# Patient Record
Sex: Male | Born: 1965
Health system: Southern US, Community
[De-identification: ages and names within clinical notes are randomized; demographics above are authoritative.]

## PROBLEM LIST (undated history)

## (undated) DIAGNOSIS — M109 Gout, unspecified: Secondary | ICD-10-CM

## (undated) DIAGNOSIS — M2550 Pain in unspecified joint: Secondary | ICD-10-CM

## (undated) DIAGNOSIS — E669 Obesity, unspecified: Secondary | ICD-10-CM

## (undated) DIAGNOSIS — I2510 Atherosclerotic heart disease of native coronary artery without angina pectoris: Secondary | ICD-10-CM

## (undated) DIAGNOSIS — I10 Essential (primary) hypertension: Secondary | ICD-10-CM

## (undated) DIAGNOSIS — I252 Old myocardial infarction: Secondary | ICD-10-CM

## (undated) DIAGNOSIS — Z8719 Personal history of other diseases of the digestive system: Secondary | ICD-10-CM

## (undated) DIAGNOSIS — E785 Hyperlipidemia, unspecified: Secondary | ICD-10-CM

## (undated) HISTORY — DX: Essential (primary) hypertension: I10

## (undated) HISTORY — DX: Personal history of other diseases of the digestive system: Z87.19

## (undated) HISTORY — DX: Gout, unspecified: M10.9

## (undated) HISTORY — PX: CARDIAC CATHETERIZATION: SHX172

## (undated) HISTORY — DX: Obesity, unspecified: E66.9

## (undated) HISTORY — DX: Atherosclerotic heart disease of native coronary artery without angina pectoris: I25.10

## (undated) HISTORY — DX: Old myocardial infarction: I25.2

## (undated) HISTORY — DX: Pain in unspecified joint: M25.50

---

## 2011-07-04 ENCOUNTER — Ambulatory Visit: Payer: Self-pay | Admitting: Internal Medicine

## 2011-07-04 VITALS — BP 170/118 | HR 84 | Temp 97.5°F | Resp 18 | Ht 69.0 in | Wt 306.0 lb

## 2011-07-04 DIAGNOSIS — E669 Obesity, unspecified: Secondary | ICD-10-CM

## 2011-07-04 DIAGNOSIS — I10 Essential (primary) hypertension: Secondary | ICD-10-CM

## 2011-07-04 LAB — COMPREHENSIVE METABOLIC PANEL
ALT: 32 U/L (ref 0–53)
AST: 23 U/L (ref 0–37)
Albumin: 4.6 g/dL (ref 3.5–5.2)
Alkaline Phosphatase: 57 U/L (ref 39–117)
BUN: 15 mg/dL (ref 6–23)
CO2: 28 mEq/L (ref 19–32)
Calcium: 9.7 mg/dL (ref 8.4–10.5)
Chloride: 104 mEq/L (ref 96–112)
Creat: 0.85 mg/dL (ref 0.50–1.35)
Glucose, Bld: 96 mg/dL (ref 70–99)
Potassium: 4.1 mEq/L (ref 3.5–5.3)
Sodium: 140 mEq/L (ref 135–145)
Total Bilirubin: 0.6 mg/dL (ref 0.3–1.2)
Total Protein: 7 g/dL (ref 6.0–8.3)

## 2011-07-04 MED ORDER — LISINOPRIL 20 MG PO TABS: 20.0000 mg | ORAL_TABLET | Freq: Every day | ORAL | Status: DC

## 2011-07-04 NOTE — Progress Notes (Signed)
  Subjective:    Patient ID: Fred Maxwell, male    DOB: August 09, 1965, 46 y.o.   MRN: [id number]  HPI 46 y/o htn male occ smoker and very obese presents because pre hire screening revealed bp 170/120 No sob, cp, ha, swelling. Also has no insurance   Review of Systems    obesity Objective:   Physical Exam  Constitutional: He is oriented to person, place, and time. He appears well-nourished.  Cardiovascular: Normal rate, regular rhythm and normal heart sounds.   Pulmonary/Chest: Effort normal and breath sounds normal.  Neurological: He is alert and oriented to person, place, and time. Coordination normal.  Skin: Skin is warm and dry.  Psychiatric: He has a normal mood and affect.   BP with thigh cuff 170/118   cmet    Assessment & Plan:  HTN and obesity Continue amlodipine 10mg  qhs Add lisinopril 20mg  qam F/up 2weeks

## 2011-07-04 NOTE — Patient Instructions (Signed)
Hypertension As your heart beats, it forces blood through your arteries. This force is your blood pressure. If the pressure is too high, it is called hypertension (HTN) or high blood pressure. HTN is dangerous because you may have it and not know it. High blood pressure may mean that your heart has to work harder to pump blood. Your arteries may be narrow or stiff. The extra work puts you at risk for heart disease, stroke, and other problems.  Blood pressure consists of two numbers, a higher number over a lower, 110/72, for example. It is stated as "110 over 72." The ideal is below 120 for the top number (systolic) and under 80 for the bottom (diastolic). Write down your blood pressure today. You should pay close attention to your blood pressure if you have certain conditions such as:  Heart failure.   Prior heart attack.   Diabetes   Chronic kidney disease.   Prior stroke.   Multiple risk factors for heart disease.  To see if you have HTN, your blood pressure should be measured while you are seated with your arm held at the level of the heart. It should be measured at least twice. A one-time elevated blood pressure reading (especially in the Emergency Department) does not mean that you need treatment. There may be conditions in which the blood pressure is different between your right and left arms. It is important to see your caregiver soon for a recheck. Most people have essential hypertension which means that there is not a specific cause. This type of high blood pressure may be lowered by changing lifestyle factors such as:  Stress.   Smoking.   Lack of exercise.   Excessive weight.   Drug/tobacco/alcohol use.   Eating less salt.  Most people do not have symptoms from high blood pressure until it has caused damage to the body. Effective treatment can often prevent, delay or reduce that damage. TREATMENT  When a cause has been identified, treatment for high blood pressure is  directed at the cause. There are a large number of medications to treat HTN. These fall into several categories, and your caregiver will help you select the medicines that are best for you. Medications may have side effects. You should review side effects with your caregiver. If your blood pressure stays high after you have made lifestyle changes or started on medicines,   Your medication(s) may need to be changed.   Other problems may need to be addressed.   Be certain you understand your prescriptions, and know how and when to take your medicine.   Be sure to follow up with your caregiver within the time frame advised (usually within two weeks) to have your blood pressure rechecked and to review your medications.   If you are taking more than one medicine to lower your blood pressure, make sure you know how and at what times they should be taken. Taking two medicines at the same time can result in blood pressure that is too low.  SEEK IMMEDIATE MEDICAL CARE IF:  You develop a severe headache, blurred or changing vision, or confusion.   You have unusual weakness or numbness, or a faint feeling.   You have severe chest or abdominal pain, vomiting, or breathing problems.  MAKE SURE YOU:   Understand these instructions.   Will watch your condition.   Will get help right away if you are not doing well or get worse.  Document Released: 01/21/2005 Document Revised: 01/10/2011 Document Reviewed:   09/11/2007 [employer] Patient Information 2012 [place], [place].DASH Diet The DASH diet stands for "Dietary Approaches to Stop Hypertension." It is a healthy eating plan that has been shown to reduce high blood pressure (hypertension) in as little as 14 days, while also possibly providing other significant health benefits. These other health benefits include reducing the risk of breast cancer after menopause and reducing the risk of type 2 diabetes, heart disease, colon cancer, and stroke. Health benefits  also include weight loss and slowing kidney failure in patients with chronic kidney disease.  DIET GUIDELINES  Limit salt (sodium). Your diet should contain less than 1500 mg of sodium daily.   Limit refined or processed carbohydrates. Your diet should include mostly whole grains. Desserts and added sugars should be used sparingly.   Include small amounts of heart-healthy fats. These types of fats include nuts, oils, and tub margarine. Limit saturated and trans fats. These fats have been shown to be harmful in the body.  CHOOSING FOODS  The following food groups are based on a 2000 calorie diet. See your Registered Dietitian for individual calorie needs. Grains and Grain Products (6 to 8 servings daily)  Eat More Often: Whole-wheat bread, brown rice, whole-grain or wheat pasta, quinoa, popcorn without added fat or salt (air popped).   Eat Less Often: White bread, white pasta, white rice, cornbread.  Vegetables (4 to 5 servings daily)  Eat More Often: Fresh, frozen, and canned vegetables. Vegetables may be raw, steamed, roasted, or grilled with a minimal amount of fat.   Eat Less Often/Avoid: Creamed or fried vegetables. Vegetables in a cheese sauce.  Fruit (4 to 5 servings daily)  Eat More Often: All fresh, canned (in natural juice), or frozen fruits. Dried fruits without added sugar. One hundred percent fruit juice ( cup [237 mL] daily).   Eat Less Often: Dried fruits with added sugar. Canned fruit in light or heavy syrup.  [employer], Fish, and Poultry (2 servings or less daily. One serving is 3 to 4 oz [85-114 g]).  Eat More Often: Ninety percent or leaner ground beef, tenderloin, sirloin. Round cuts of beef, chicken breast, [place] breast. All fish. Grill, bake, or broil your meat. Nothing should be fried.   Eat Less Often/Avoid: Fatty cuts of meat, [place], or chicken leg, thigh, or wing. Fried cuts of meat or fish.  Dairy (2 to 3 servings)  Eat More Often: Low-fat or fat-free  milk, low-fat plain or light yogurt, reduced-fat or part-skim cheese.   Eat Less Often/Avoid: Milk (whole, 2%, skim, or chocolate).Whole milk yogurt. Full-fat cheeses.  Nuts, Seeds, and Legumes (4 to 5 servings per week)  Eat More Often: All without added salt.   Eat Less Often/Avoid: Salted nuts and seeds, canned beans with added salt.  Fats and Sweets (limited)  Eat More Often: Vegetable oils, tub margarines without trans fats, sugar-free gelatin. Mayonnaise and salad dressings.   Eat Less Often/Avoid: Coconut oils, palm oils, butter, stick margarine, cream, half and half, cookies, candy, pie.  FOR MORE INFORMATION The Dash Diet Eating Plan: www.dashdiet.org Document Released: 01/10/2011 Document Reviewed: 12/31/2010 [facility] Patient Information 2012 [place], [place].Sleep Apnea Sleep apnea is a common disorder. The main problem of this disorder is excessive daytime sleepiness and compromised quality of life. This may include social and emotional problems. There are two types of sleep apnea.  Obstructive sleep apnea is when breathing stops due to a blocked airway.   Central sleep apnea is a malfunction of the brain's normal signal to breathe.  SYMPTOMS  Restless sleep.   Falling asleep while driving and/or during the day.   Loss of energy.   Irritability.   Mood or behavior changes.   Loud, heavy snoring.   Morning headaches.   Trouble concentrating.   Forgetfulness.   Anxiety or depression.   Decreased interest in sex.  Not all people with sleep apnea have all of these symptoms. However, people who have a few of these symptoms should visit their caregiver for an evaluation. Problems related to untreated sleep apnea include:  High blood pressure (hypertension).   Coronary artery disease.   Impotence.   Cognitive dysfunction.   Memory loss.  TREATMENT  For mild cases, treatment may include avoiding sleeping on one's back.   For people with nasal  congestion, a decongestant may be prescribed.   Patients with obstructive and central apnea should avoid depressants. This includes alcohol, sedatives and narcotics. Weight loss and diet control are encouraged for overweight patients.   Many serious cases of obstructive sleep apnea can be relieved by a treatment called nasal continuous positive airway pressure (nasal CPAP). Nasal CPAP uses a mask-like device and pump that work together to keep the airway open. The pump delivers air pressure during each breath.   Surgery may help some patients by stopping or reducing the narrowing of the airway due to anatomical defects.  PROGNOSIS  Removing the obstruction usually reverses hypertension and cardiac problems. Untreated, sleep apnea sufferers have a tendency to fall asleep during the day. This is can result in serious accident or loss of ones job. RESEARCH Sleep apnea is currently one of the most active areas of sleep research.  Document Released: 01/11/2002 Document Revised: 01/10/2011 Document Reviewed: 05/09/2005 [facility] Patient Information 2012 [place], [place].

## 2011-07-09 ENCOUNTER — Encounter: Payer: Self-pay | Admitting: *Deleted

## 2015-03-24 ENCOUNTER — Encounter (HOSPITAL_COMMUNITY): Payer: Self-pay | Admitting: Emergency Medicine

## 2015-03-24 ENCOUNTER — Emergency Department (HOSPITAL_COMMUNITY): Payer: PRIVATE HEALTH INSURANCE

## 2015-03-24 ENCOUNTER — Emergency Department (HOSPITAL_COMMUNITY)
Admission: EM | Admit: 2015-03-24 | Discharge: 2015-03-24 | Disposition: A | Discharge status: Home / Self Care | Payer: PRIVATE HEALTH INSURANCE | Attending: Physician Assistant | Admitting: Physician Assistant

## 2015-03-24 DIAGNOSIS — N50811 Right testicular pain: Secondary | ICD-10-CM | POA: Diagnosis present

## 2015-03-24 DIAGNOSIS — F17200 Nicotine dependence, unspecified, uncomplicated: Secondary | ICD-10-CM | POA: Diagnosis not present

## 2015-03-24 DIAGNOSIS — Z79899 Other long term (current) drug therapy: Secondary | ICD-10-CM | POA: Insufficient documentation

## 2015-03-24 DIAGNOSIS — N50819 Testicular pain, unspecified: Secondary | ICD-10-CM

## 2015-03-24 DIAGNOSIS — I10 Essential (primary) hypertension: Secondary | ICD-10-CM | POA: Diagnosis not present

## 2015-03-24 NOTE — ED Notes (Signed)
Ultrasound in process at bedside at this time. 

## 2015-03-24 NOTE — ED Notes (Signed)
Bed: JJ88 Expected date:  Expected time:  Means of arrival:  Comments: Hold for [facility]

## 2015-03-24 NOTE — ED Notes (Signed)
Pt states that he has had R sided testicular pain x 6 days. Came in by PCP to r/o torsion. Alert and oriented. Denies dysuria.

## 2015-03-24 NOTE — ED Provider Notes (Signed)
CSN: [id number]     Arrival date & time 03/24/15  1952 History   First MD Initiated Contact with Patient 03/24/15 1958     Chief Complaint  Patient presents with  . Testicle Pain     (Consider location/radiation/quality/duration/timing/severity/associated sxs/prior Treatment) HPI Patient presents to the emergency department with right testicle pain that started 6 days ago.  The patient states that he was seen by a walk-in clinic and they felt that his right testicle is high riding and they were concerned about it.  Patient states that it actually has not been hurting as much over the last 12 hours.  Patient states that nothing seems to make the condition better or worse.  Patient denies abdominal pain, nausea, vomiting, weakness, dizziness, headache, blurred vision, diarrhea, fever, incontinence, chest pain, shortness of breath, near syncope or syncope.  The patient states that he has had no problems urinating and no pain with urination Past Medical History  Diagnosis Date  . Hypertension    History reviewed. No pertinent past surgical history. History reviewed. No pertinent family history. Social History  Substance Use Topics  . Smoking status: Current Some Day Smoker  . Smokeless tobacco: None  . Alcohol Use: None    Review of Systems All other systems negative except as documented in the HPI. All pertinent positives and negatives as reviewed in the HPI.   Allergies  Morphine and related  Home Medications   Prior to Admission medications   Medication Sig Start Date End Date Taking? Authorizing Provider  ibuprofen (ADVIL,MOTRIN) 200 MG tablet Take 800 mg by mouth every 6 (six) hours as needed for moderate pain.   Yes Historical Provider, MD  lisinopril (PRINIVIL,ZESTRIL) 20 MG tablet Take 20 mg by mouth daily.   Yes Historical Provider, MD   BP 152/101 mmHg  Pulse 78  Temp(Src) 97.6 F (36.4 C) (Oral)  Resp 18  SpO2 100% Physical Exam  Constitutional: He is oriented to  person, place, and time. He appears well-developed and well-nourished. No distress.  HENT:  Head: Normocephalic and atraumatic.  Mouth/Throat: Oropharynx is clear and moist.  Eyes: Pupils are equal, round, and reactive to light.  Neck: Normal range of motion. Neck supple.  Cardiovascular: Normal rate, regular rhythm and normal heart sounds.  Exam reveals no gallop and no friction rub.   No murmur heard. Pulmonary/Chest: Effort normal and breath sounds normal. No respiratory distress. He has no wheezes.  Abdominal: Soft. Bowel sounds are normal. He exhibits no distension. There is no tenderness.  Genitourinary: Penis normal. Right testis shows no mass, no swelling and no tenderness. Right testis is descended. Left testis shows no mass, no swelling and no tenderness. Left testis is descended.  The patient testicle and did not appear to be into the inguinal canal.  I did attempt to relocate the testicle back into the scrotum and was successful.  The patient did feel some relief  Neurological: He is alert and oriented to person, place, and time. He exhibits normal muscle tone. Coordination normal.  Skin: Skin is warm and dry. No rash noted. No erythema.  Psychiatric: He has a normal mood and affect. His behavior is normal.  Nursing note and vitals reviewed.   ED Course  Procedures (including critical care time) Labs Review Labs Reviewed - No data to display  Imaging Review US Scrotum  03/24/2015  CLINICAL DATA:  Right-sided scrotal pain 6 days. Evaluate for testicular torsion. EXAM: SCROTAL ULTRASOUND DOPPLER ULTRASOUND OF THE TESTICLES TECHNIQUE: Complete ultrasound  examination of the testicles, epididymis, and other scrotal structures was performed. Color and spectral Doppler ultrasound were also utilized to evaluate blood flow to the testicles. COMPARISON:  None. FINDINGS: Right testicle Measurements: 1.8 x 2.6 x 3.3 cm. No mass or microlithiasis visualized. Focal calcification inferior right  scrotum without clinical significance. Left testicle Measurements: 1.2 x 2.6 x 3.8 cm. No mass or microlithiasis visualized. Right epididymis:  Normal in size and appearance. Left epididymis:  Normal in size and appearance. Hydrocele:  Small bilateral hydroceles. Varicocele:  None visualized. Pulsed Doppler interrogation of both testes demonstrates normal low resistance arterial and venous waveforms bilaterally. IMPRESSION: Normal testicles without evidence of torsion. Small bilateral hydroceles. Electronically Signed   By: Elberta Fortis M.D.   On: 03/24/2015 21:28   [place] Art/ven Flow Abd Pelv Doppler  03/24/2015  CLINICAL DATA:  Right-sided scrotal pain 6 days. Evaluate for testicular torsion. EXAM: SCROTAL ULTRASOUND DOPPLER ULTRASOUND OF THE TESTICLES TECHNIQUE: Complete ultrasound examination of the testicles, epididymis, and other scrotal structures was performed. Color and spectral Doppler ultrasound were also utilized to evaluate blood flow to the testicles. COMPARISON:  None. FINDINGS: Right testicle Measurements: 1.8 x 2.6 x 3.3 cm. No mass or microlithiasis visualized. Focal calcification inferior right scrotum without clinical significance. Left testicle Measurements: 1.2 x 2.6 x 3.8 cm. No mass or microlithiasis visualized. Right epididymis:  Normal in size and appearance. Left epididymis:  Normal in size and appearance. Hydrocele:  Small bilateral hydroceles. Varicocele:  None visualized. Pulsed Doppler interrogation of both testes demonstrates normal low resistance arterial and venous waveforms bilaterally. IMPRESSION: Normal testicles without evidence of torsion. Small bilateral hydroceles. Electronically Signed   By: Elberta Fortis M.D.   On: 03/24/2015 21:28   I have personally reviewed and evaluated these images and lab results as part of my medical decision-making.    I will refer the patient to urology.  Told to return here as needed.  The patient's ultrasound did not show any abnormality  other than small hydroceles bilaterally but I do not feel this is causing his pain.  I feel this is more related to the fact the testicles was not completely descended in proper position in the scrotum  Charlestine Night, PA-C 03/24/15 2206  Courteney Lyn Mackuen, MD 03/24/15 2247

## 2015-03-24 NOTE — Discharge Instructions (Signed)
Tylenol and motrin for pain.

## 2016-12-25 ENCOUNTER — Emergency Department (HOSPITAL_COMMUNITY): Payer: 59

## 2016-12-25 ENCOUNTER — Encounter (HOSPITAL_COMMUNITY)
Admission: EM | Disposition: A | Discharge status: Home / Self Care | Payer: Self-pay | Source: Home / Self Care | Attending: Internal Medicine

## 2016-12-25 ENCOUNTER — Encounter (HOSPITAL_COMMUNITY): Payer: Self-pay | Admitting: *Deleted

## 2016-12-25 ENCOUNTER — Inpatient Hospital Stay (HOSPITAL_COMMUNITY)
Admission: EM | Admit: 2016-12-25 | Discharge: 2016-12-27 | DRG: 247 | Disposition: A | Discharge status: Home / Self Care | Payer: 59 | Attending: Internal Medicine | Admitting: Internal Medicine

## 2016-12-25 DIAGNOSIS — Z8249 Family history of ischemic heart disease and other diseases of the circulatory system: Secondary | ICD-10-CM

## 2016-12-25 DIAGNOSIS — Z23 Encounter for immunization: Secondary | ICD-10-CM

## 2016-12-25 DIAGNOSIS — Z7951 Long term (current) use of inhaled steroids: Secondary | ICD-10-CM

## 2016-12-25 DIAGNOSIS — I252 Old myocardial infarction: Secondary | ICD-10-CM

## 2016-12-25 DIAGNOSIS — M109 Gout, unspecified: Secondary | ICD-10-CM | POA: Diagnosis present

## 2016-12-25 DIAGNOSIS — R079 Chest pain, unspecified: Secondary | ICD-10-CM | POA: Diagnosis present

## 2016-12-25 DIAGNOSIS — I2510 Atherosclerotic heart disease of native coronary artery without angina pectoris: Secondary | ICD-10-CM | POA: Diagnosis present

## 2016-12-25 DIAGNOSIS — Z955 Presence of coronary angioplasty implant and graft: Secondary | ICD-10-CM

## 2016-12-25 DIAGNOSIS — I10 Essential (primary) hypertension: Secondary | ICD-10-CM | POA: Diagnosis present

## 2016-12-25 DIAGNOSIS — I214 Non-ST elevation (NSTEMI) myocardial infarction: Principal | ICD-10-CM

## 2016-12-25 DIAGNOSIS — E785 Hyperlipidemia, unspecified: Secondary | ICD-10-CM | POA: Diagnosis present

## 2016-12-25 DIAGNOSIS — I160 Hypertensive urgency: Secondary | ICD-10-CM | POA: Diagnosis present

## 2016-12-25 DIAGNOSIS — G4730 Sleep apnea, unspecified: Secondary | ICD-10-CM | POA: Diagnosis present

## 2016-12-25 DIAGNOSIS — K219 Gastro-esophageal reflux disease without esophagitis: Secondary | ICD-10-CM | POA: Diagnosis present

## 2016-12-25 DIAGNOSIS — Z885 Allergy status to narcotic agent status: Secondary | ICD-10-CM | POA: Diagnosis not present

## 2016-12-25 DIAGNOSIS — Z6841 Body Mass Index (BMI) 40.0 and over, adult: Secondary | ICD-10-CM | POA: Diagnosis not present

## 2016-12-25 HISTORY — DX: Old myocardial infarction: I25.2

## 2016-12-25 HISTORY — PX: LEFT HEART CATH AND CORONARY ANGIOGRAPHY: CATH118249

## 2016-12-25 HISTORY — PX: CORONARY STENT INTERVENTION: CATH118234

## 2016-12-25 LAB — PROTIME-INR
INR: 0.97
Prothrombin Time: 12.8 seconds (ref 11.4–15.2)

## 2016-12-25 LAB — CBC
HCT: 44 % (ref 39.0–52.0)
Hemoglobin: 15.9 g/dL (ref 13.0–17.0)
MCH: 33.3 pg (ref 26.0–34.0)
MCHC: 36.1 g/dL — ABNORMAL HIGH (ref 30.0–36.0)
MCV: 92.1 fL (ref 78.0–100.0)
Platelets: 156 10*3/uL (ref 150–400)
RBC: 4.78 MIL/uL (ref 4.22–5.81)
RDW: 12.6 % (ref 11.5–15.5)
WBC: 8.3 10*3/uL (ref 4.0–10.5)

## 2016-12-25 LAB — TROPONIN I
Troponin I: 0.8 ng/mL (ref <0.03)
Troponin I: 4.28 ng/mL (ref <0.03)
Troponin I: 34.41 ng/mL (ref <0.03)

## 2016-12-25 LAB — BASIC METABOLIC PANEL
Anion gap: 8 (ref 5–15)
BUN: 10 mg/dL (ref 6–20)
CO2: 26 mmol/L (ref 22–32)
Calcium: 10.9 mg/dL — ABNORMAL HIGH (ref 8.9–10.3)
Chloride: 104 mmol/L (ref 101–111)
Creatinine, Ser: 0.89 mg/dL (ref 0.61–1.24)
GFR calc Af Amer: >60 mL/min (ref >60)
GFR calc non Af Amer: >60 mL/min (ref >60)
Glucose, Bld: 132 mg/dL — ABNORMAL HIGH (ref 65–99)
Potassium: 4 mmol/L (ref 3.5–5.1)
Sodium: 138 mmol/L (ref 135–145)

## 2016-12-25 LAB — LIPID PANEL
Cholesterol: 177 mg/dL (ref 0–200)
HDL: 39 mg/dL — ABNORMAL LOW (ref >40)
LDL Cholesterol: 118 mg/dL — ABNORMAL HIGH (ref 0–99)
Total CHOL/HDL Ratio: 4.5 RATIO
Triglycerides: 102 mg/dL (ref <150)
VLDL: 20 mg/dL (ref 0–40)

## 2016-12-25 LAB — HEMOGLOBIN A1C
Hgb A1c MFr Bld: 5.6 % (ref 4.8–5.6)
Mean Plasma Glucose: 114.02 mg/dL

## 2016-12-25 LAB — RAPID URINE DRUG SCREEN, HOSP PERFORMED
Amphetamines: NOT DETECTED
Barbiturates: NOT DETECTED
Benzodiazepines: NOT DETECTED
Cocaine: NOT DETECTED
Opiates: NOT DETECTED
Tetrahydrocannabinol: NOT DETECTED

## 2016-12-25 LAB — I-STAT TROPONIN, ED: Troponin i, poc: 0.26 ng/mL (ref 0.00–0.08)

## 2016-12-25 LAB — POCT ACTIVATED CLOTTING TIME
Activated Clotting Time: 241 seconds
Activated Clotting Time: 422 seconds

## 2016-12-25 SURGERY — LEFT HEART CATH AND CORONARY ANGIOGRAPHY
Anesthesia: LOCAL

## 2016-12-25 MED ORDER — FENTANYL CITRATE (PF) 100 MCG/2ML IJ SOLN
INTRAMUSCULAR | Status: AC
  Filled 2016-12-25: qty 2

## 2016-12-25 MED ORDER — ONDANSETRON HCL 4 MG/2ML IJ SOLN
4.0000 mg | Freq: Four times a day (QID) | INTRAMUSCULAR | Status: DC | PRN
Start: 2016-12-25 — End: 2016-12-27
  Administered 2016-12-25: 4 mg via INTRAVENOUS
  Filled 2016-12-25: qty 2

## 2016-12-25 MED ORDER — ACETAMINOPHEN 325 MG PO TABS
650.0000 mg | ORAL_TABLET | ORAL | Status: DC | PRN
  Administered 2016-12-25 – 2016-12-27 (×7): 650 mg via ORAL
  Filled 2016-12-25 (×8): qty 2

## 2016-12-25 MED ORDER — TICAGRELOR 90 MG PO TABS
ORAL_TABLET | ORAL | Status: AC
  Filled 2016-12-25: qty 2

## 2016-12-25 MED ORDER — FENTANYL CITRATE (PF) 100 MCG/2ML IJ SOLN
50.0000 ug | Freq: Once | INTRAMUSCULAR | Status: AC
  Administered 2016-12-25: 50 ug via INTRAVENOUS
  Filled 2016-12-25: qty 2

## 2016-12-25 MED ORDER — IOPAMIDOL (ISOVUE-370) INJECTION 76%
INTRAVENOUS | Status: AC
  Filled 2016-12-25: qty 100

## 2016-12-25 MED ORDER — HEPARIN (PORCINE) IN NACL 2-0.9 UNIT/ML-% IJ SOLN
INTRAMUSCULAR | Status: AC
  Filled 2016-12-25: qty 1000

## 2016-12-25 MED ORDER — PNEUMOCOCCAL VAC POLYVALENT 25 MCG/0.5ML IJ INJ
0.5000 mL | INJECTION | INTRAMUSCULAR | Status: AC
  Administered 2016-12-26: 0.5 mL via INTRAMUSCULAR
  Filled 2016-12-25: qty 0.5

## 2016-12-25 MED ORDER — SODIUM CHLORIDE 0.9 % IV SOLN: 250.0000 mL | INTRAVENOUS | Status: DC | PRN

## 2016-12-25 MED ORDER — FLUTICASONE PROPIONATE 50 MCG/ACT NA SUSP: 2.0000 | Freq: Every day | NASAL | Status: DC | PRN

## 2016-12-25 MED ORDER — LABETALOL HCL 5 MG/ML IV SOLN
10.0000 mg | INTRAVENOUS | Status: AC | PRN
  Administered 2016-12-25: 10 mg via INTRAVENOUS
  Filled 2016-12-25 (×3): qty 4

## 2016-12-25 MED ORDER — PANTOPRAZOLE SODIUM 40 MG IV SOLR
40.0000 mg | Freq: Every day | INTRAVENOUS | Status: DC
  Administered 2016-12-25 – 2016-12-26 (×2): 40 mg via INTRAVENOUS
  Filled 2016-12-25 (×2): qty 40

## 2016-12-25 MED ORDER — SODIUM CHLORIDE 0.9% FLUSH
3.0000 mL | Freq: Two times a day (BID) | INTRAVENOUS | Status: DC
  Administered 2016-12-25 – 2016-12-27 (×4): 3 mL via INTRAVENOUS

## 2016-12-25 MED ORDER — INFLUENZA VAC SPLIT QUAD 0.5 ML IM SUSY
0.5000 mL | PREFILLED_SYRINGE | INTRAMUSCULAR | Status: AC
  Administered 2016-12-26: 0.5 mL via INTRAMUSCULAR
  Filled 2016-12-25: qty 0.5

## 2016-12-25 MED ORDER — HEPARIN (PORCINE) IN NACL 100-0.45 UNIT/ML-% IJ SOLN
1350.0000 [IU]/h | INTRAMUSCULAR | Status: DC
  Administered 2016-12-25: 1350 [IU]/h via INTRAVENOUS
  Filled 2016-12-25: qty 250

## 2016-12-25 MED ORDER — ALPRAZOLAM 0.25 MG PO TABS
0.2500 mg | ORAL_TABLET | Freq: Two times a day (BID) | ORAL | Status: DC | PRN
  Administered 2016-12-25: 0.25 mg via ORAL
  Filled 2016-12-25: qty 1

## 2016-12-25 MED ORDER — MIDAZOLAM HCL 2 MG/2ML IJ SOLN
INTRAMUSCULAR | Status: AC
  Filled 2016-12-25: qty 2

## 2016-12-25 MED ORDER — ZOLPIDEM TARTRATE 5 MG PO TABS: 5.0000 mg | ORAL_TABLET | Freq: Every evening | ORAL | Status: DC | PRN

## 2016-12-25 MED ORDER — NITROGLYCERIN 1 MG/10 ML FOR IR/CATH LAB
INTRA_ARTERIAL | Status: DC | PRN
  Administered 2016-12-25: 200 ug via INTRACORONARY
  Administered 2016-12-25: 100 ug via INTRACORONARY

## 2016-12-25 MED ORDER — GI COCKTAIL ~~LOC~~: 30.0000 mL | Freq: Once | ORAL | Status: DC

## 2016-12-25 MED ORDER — IRBESARTAN 150 MG PO TABS
150.0000 mg | ORAL_TABLET | Freq: Every day | ORAL | Status: DC
  Filled 2016-12-25: qty 1

## 2016-12-25 MED ORDER — IOPAMIDOL (ISOVUE-370) INJECTION 76%
INTRAVENOUS | Status: DC | PRN
  Administered 2016-12-25: 180 mL via INTRA_ARTERIAL

## 2016-12-25 MED ORDER — METOPROLOL TARTRATE 25 MG PO TABS
25.0000 mg | ORAL_TABLET | Freq: Two times a day (BID) | ORAL | Status: DC
  Administered 2016-12-25: 25 mg via ORAL
  Filled 2016-12-25: qty 1

## 2016-12-25 MED ORDER — HEPARIN SODIUM (PORCINE) 1000 UNIT/ML IJ SOLN
INTRAMUSCULAR | Status: AC
  Filled 2016-12-25: qty 1

## 2016-12-25 MED ORDER — HEPARIN (PORCINE) IN NACL 100-0.45 UNIT/ML-% IJ SOLN: 12.0000 [IU]/kg/h | INTRAMUSCULAR | Status: DC

## 2016-12-25 MED ORDER — HEPARIN BOLUS VIA INFUSION
4000.0000 [IU] | Freq: Once | INTRAVENOUS | Status: AC
  Administered 2016-12-25: 4000 [IU] via INTRAVENOUS
  Filled 2016-12-25: qty 4000

## 2016-12-25 MED ORDER — VERAPAMIL HCL 2.5 MG/ML IV SOLN
INTRAVENOUS | Status: DC | PRN
  Administered 2016-12-25: 16:00:00 via INTRA_ARTERIAL

## 2016-12-25 MED ORDER — ASPIRIN EC 81 MG PO TBEC
81.0000 mg | DELAYED_RELEASE_TABLET | Freq: Every day | ORAL | Status: DC
  Administered 2016-12-26 – 2016-12-27 (×2): 81 mg via ORAL
  Filled 2016-12-25 (×2): qty 1

## 2016-12-25 MED ORDER — LIDOCAINE HCL (PF) 1 % IJ SOLN
INTRAMUSCULAR | Status: AC
  Filled 2016-12-25: qty 30

## 2016-12-25 MED ORDER — SODIUM CHLORIDE 0.9 % IV SOLN
INTRAVENOUS | Status: AC
  Administered 2016-12-25 – 2016-12-26 (×2): via INTRAVENOUS

## 2016-12-25 MED ORDER — ENOXAPARIN SODIUM 40 MG/0.4ML ~~LOC~~ SOLN
40.0000 mg | SUBCUTANEOUS | Status: DC
  Administered 2016-12-26 – 2016-12-27 (×2): 40 mg via SUBCUTANEOUS
  Filled 2016-12-25 (×2): qty 0.4

## 2016-12-25 MED ORDER — VERAPAMIL HCL 2.5 MG/ML IV SOLN
INTRAVENOUS | Status: AC
  Filled 2016-12-25: qty 2

## 2016-12-25 MED ORDER — ONDANSETRON HCL 4 MG/2ML IJ SOLN
4.0000 mg | Freq: Once | INTRAMUSCULAR | Status: AC
  Administered 2016-12-25: 4 mg via INTRAVENOUS
  Filled 2016-12-25: qty 2

## 2016-12-25 MED ORDER — ASPIRIN 300 MG RE SUPP: 300.0000 mg | RECTAL | Status: DC

## 2016-12-25 MED ORDER — ASPIRIN 81 MG PO CHEW: 324.0000 mg | CHEWABLE_TABLET | ORAL | Status: DC

## 2016-12-25 MED ORDER — FENTANYL CITRATE (PF) 100 MCG/2ML IJ SOLN
100.0000 ug | Freq: Once | INTRAMUSCULAR | Status: AC
  Administered 2016-12-25: 100 ug via INTRAVENOUS
  Filled 2016-12-25: qty 2

## 2016-12-25 MED ORDER — IOPAMIDOL (ISOVUE-370) INJECTION 76%
INTRAVENOUS | Status: AC
  Administered 2016-12-25: 100 mL
  Filled 2016-12-25: qty 100

## 2016-12-25 MED ORDER — METOPROLOL TARTRATE 12.5 MG HALF TABLET
12.5000 mg | ORAL_TABLET | Freq: Two times a day (BID) | ORAL | Status: DC
  Administered 2016-12-25: 12.5 mg via ORAL
  Filled 2016-12-25: qty 1

## 2016-12-25 MED ORDER — ASPIRIN 81 MG PO CHEW
81.0000 mg | CHEWABLE_TABLET | Freq: Once | ORAL | Status: AC
  Administered 2016-12-25: 81 mg via ORAL
  Filled 2016-12-25: qty 1

## 2016-12-25 MED ORDER — SODIUM CHLORIDE 0.9% FLUSH: 3.0000 mL | INTRAVENOUS | Status: DC | PRN

## 2016-12-25 MED ORDER — ATORVASTATIN CALCIUM 80 MG PO TABS
80.0000 mg | ORAL_TABLET | Freq: Every day | ORAL | Status: DC
  Administered 2016-12-25 – 2016-12-26 (×2): 80 mg via ORAL
  Filled 2016-12-25 (×2): qty 1

## 2016-12-25 MED ORDER — HEPARIN SODIUM (PORCINE) 1000 UNIT/ML IJ SOLN
INTRAMUSCULAR | Status: DC | PRN
  Administered 2016-12-25: 6500 [IU] via INTRAVENOUS
  Administered 2016-12-25: 6000 [IU] via INTRAVENOUS
  Administered 2016-12-25: 3000 [IU] via INTRAVENOUS

## 2016-12-25 MED ORDER — HEPARIN (PORCINE) IN NACL 2-0.9 UNIT/ML-% IJ SOLN
INTRAMUSCULAR | Status: AC | PRN
  Administered 2016-12-25: 1000 mL

## 2016-12-25 MED ORDER — TICAGRELOR 90 MG PO TABS
ORAL_TABLET | ORAL | Status: DC | PRN
  Administered 2016-12-25: 180 mg via ORAL

## 2016-12-25 MED ORDER — ASPIRIN 81 MG PO CHEW
243.0000 mg | CHEWABLE_TABLET | Freq: Once | ORAL | Status: AC
  Administered 2016-12-25: 243 mg via ORAL
  Filled 2016-12-25: qty 3

## 2016-12-25 MED ORDER — LIDOCAINE HCL (PF) 1 % IJ SOLN
INTRAMUSCULAR | Status: DC | PRN
  Administered 2016-12-25: 2 mL

## 2016-12-25 MED ORDER — MIDAZOLAM HCL 2 MG/2ML IJ SOLN
INTRAMUSCULAR | Status: DC | PRN
  Administered 2016-12-25: 2 mg via INTRAVENOUS

## 2016-12-25 MED ORDER — NITROGLYCERIN 1 MG/10 ML FOR IR/CATH LAB
INTRA_ARTERIAL | Status: AC
  Filled 2016-12-25: qty 10

## 2016-12-25 MED ORDER — NITROGLYCERIN 0.4 MG SL SUBL
0.4000 mg | SUBLINGUAL_TABLET | SUBLINGUAL | Status: AC | PRN
  Administered 2016-12-25 (×3): 0.4 mg via SUBLINGUAL
  Filled 2016-12-25 (×2): qty 1

## 2016-12-25 MED ORDER — FENTANYL CITRATE (PF) 100 MCG/2ML IJ SOLN
100.0000 ug | Freq: Once | INTRAMUSCULAR | Status: AC
  Administered 2016-12-25: 100 ug via INTRAVENOUS

## 2016-12-25 MED ORDER — TICAGRELOR 90 MG PO TABS
90.0000 mg | ORAL_TABLET | Freq: Two times a day (BID) | ORAL | Status: DC
  Administered 2016-12-26 – 2016-12-27 (×3): 90 mg via ORAL
  Filled 2016-12-25 (×3): qty 1

## 2016-12-25 SURGICAL SUPPLY — 17 items
BALLN SAPPHIRE 2.0X12 (BALLOONS) ×2
BALLOON SAPPHIRE 2.0X12 (BALLOONS) ×1 IMPLANT
CATH INFINITI 5FR ANG PIGTAIL (CATHETERS) ×2 IMPLANT
CATH OPTITORQUE JACKY 4.0 5F (CATHETERS) ×2 IMPLANT
CATH VISTA GUIDE 6FR XB3 (CATHETERS) ×4 IMPLANT
DEVICE RAD COMP TR BAND LRG (VASCULAR PRODUCTS) ×2 IMPLANT
GLIDESHEATH SLEND SS 6F .021 (SHEATH) ×2 IMPLANT
GUIDEWIRE INQWIRE 1.5J.035X260 (WIRE) ×1 IMPLANT
INQWIRE 1.5J .035X260CM (WIRE) ×2
KIT ENCORE 26 ADVANTAGE (KITS) ×2 IMPLANT
KIT HEART LEFT (KITS) ×2 IMPLANT
PACK CARDIAC CATHETERIZATION (CUSTOM PROCEDURE TRAY) ×2 IMPLANT
STENT RESOLUTE ONYX 2.0X30 (Permanent Stent) ×2 IMPLANT
SYR MEDRAD MARK V 150ML (SYRINGE) ×2 IMPLANT
TRANSDUCER W/STOPCOCK (MISCELLANEOUS) ×2 IMPLANT
TUBING CIL FLEX 10 FLL-RA (TUBING) ×2 IMPLANT
WIRE RUNTHROUGH .014X180CM (WIRE) ×2 IMPLANT

## 2016-12-25 NOTE — ED Triage Notes (Signed)
Pt reports rt chest and arm pain that has been intermittent for 2 days. Pt states that it has been constant this morning ans reports taking tums with no relief.

## 2016-12-25 NOTE — Progress Notes (Signed)
Cardiology made aware of troponin 34.41. Pt A&O, denies chest pain/discomfort. Pt having hypertension (SBP 150's-170's). PRN Labetalol discontinues at 0000. Dr. Dimple CaseyPauley modifying Labetalol orders.  No further orders at this time.  Will continue to monitor.

## 2016-12-25 NOTE — ED Provider Notes (Signed)
CRITICAL CARE Performed by: Azalia BilisKevin Carnell Beavers Total critical care time: 31 minutes Critical care time was exclusive of separately billable procedures and treating other patients. Critical care was necessary to treat or prevent imminent or life-threatening deterioration. Critical care was time spent personally by me on the following activities: development of treatment plan with patient and/or surrogate as well as nursing, discussions with consultants, evaluation of patient's response to treatment, examination of patient, obtaining history from patient or surrogate, ordering and performing treatments and interventions, ordering and review of laboratory studies, ordering and review of radiographic studies, pulse oximetry and re-evaluation of patient's condition.  Still with CP at this time although improving. Increasing troponin in the ER. Initiation of heparin now. Aspirin now. Cardiology at bedside with plan for urgent cath. CT imaging without acute abnormality.   I personally reviewed the imaging tests through PACS system I reviewed available ER/hospitalization records through the EMR     Azalia Bilisampos, Shenia Alan, MD 12/25/16 1151

## 2016-12-25 NOTE — ED Provider Notes (Signed)
[facility] EMERGENCY DEPARTMENT Provider Note   CSN: [id number] Arrival date & time: 12/25/16  4098     History   Chief Complaint Chief Complaint  Patient presents with  . Chest Pain    HPI Fred Maxwell is a 51 y.o. male.  The history is provided by the patient.  Chest Pain   This is a new problem. The current episode started more than 2 days ago. The problem occurs rarely. The problem has been gradually worsening. The pain is associated with rest. The pain is present in the lateral region. The pain is severe. The quality of the pain is described as sharp. The pain radiates to the right shoulder. Pertinent negatives include no abdominal pain, no diaphoresis, no shortness of breath, no sputum production and no vomiting. He has tried antacids for the symptoms. The treatment provided no relief. Risk factors include male gender and obesity.  His past medical history is significant for hypertension.  Pertinent negatives for past medical history include no aneurysm.  Pertinent negatives for family medical history include: no Marfan's syndrome.  Procedure history is negative for stress echo.  Symptoms started Sunday lasting approximately 1-2 hours at a time.  Sunday woke him from sleep, Monday while doing yard work and then this evening awoke him from sleep.  Patient is having watering of the mouth with the episodes and thought it was indigestion and gas but gas x and tums have not relieved his symptoms.  Symptoms are in the wrist and upper right chest at the same time.  He has shoulder pain as well but reports has had a spur in that shoulder that he is getting injections for.   No f/c/r. No leg pain but kneels for work a lot.  No car trips or plane trips.    Past Medical History:  Diagnosis Date  . Hypertension     Patient Active Problem List   Diagnosis Date Noted  . HTN (hypertension) 07/04/2011    History reviewed. No pertinent surgical  history.     Home Medications    Prior to Admission medications   Medication Sig Start Date End Date Taking? Authorizing Provider  fluticasone (FLONASE) 50 MCG/ACT nasal spray Place 2 sprays into both nostrils daily as needed for allergies or rhinitis.   Yes [provider]  ibuprofen (ADVIL,MOTRIN) 200 MG tablet Take 600-800 mg by mouth every 6 (six) hours as needed for headache or mild pain.   Yes [provider]  irbesartan (AVAPRO) 150 MG tablet Take 150 mg by mouth daily.   Yes [provider]    Family History No family history on file.  Social History Social History   Tobacco Use  . Smoking status: Current Some Day Smoker  Substance Use Topics  . Alcohol use: Not on file  . Drug use: Not on file     Allergies   Morphine and related   Review of Systems Review of Systems  Constitutional: Negative for diaphoresis.  Respiratory: Negative for sputum production and shortness of breath.   Cardiovascular: Positive for chest pain.  Gastrointestinal: Negative for abdominal pain and vomiting.  All other systems reviewed and are negative.    Physical Exam Updated Vital Signs BP (!) 146/81   Pulse 60   Temp 97.8 F (36.6 C) (Oral)   Resp 17   SpO2 97%   Physical Exam  Constitutional: He is oriented to person, place, and time. He appears well-developed and well-nourished. No distress.  HENT:  Head: Normocephalic and atraumatic.  Mouth/Throat: No oropharyngeal exudate.  Eyes: Conjunctivae are normal. Pupils are equal, round, and reactive to light.  Neck: Normal range of motion. Neck supple. No JVD present.  Cardiovascular: Normal rate, regular rhythm, normal heart sounds and intact distal pulses.  Pulmonary/Chest: Effort normal and breath sounds normal. No stridor. He has no wheezes. He has no rales.  Abdominal: Soft. Bowel sounds are normal. He exhibits no mass. There is no tenderness. There is no rebound and no guarding.   Musculoskeletal: Normal range of motion. He exhibits no edema, tenderness or deformity.  Neurological: He is alert and oriented to person, place, and time. He displays normal reflexes.  Skin: Skin is warm and dry. Capillary refill takes less than 2 seconds. He is not diaphoretic.  Psychiatric: He has a normal mood and affect.     ED Treatments / Results  Labs (all labs ordered are listed, but only abnormal results are displayed)  Results for orders placed or performed during the hospital encounter of 12/25/16  Basic metabolic panel  Result Value Ref Range   Sodium 138 135 - 145 mmol/L   Potassium 4.0 3.5 - 5.1 mmol/L   Chloride 104 101 - 111 mmol/L   CO2 26 22 - 32 mmol/L   Glucose, Bld 132 (H) 65 - 99 mg/dL   BUN 10 6 - 20 mg/dL   Creatinine, Ser 1.610.89 0.61 - 1.24 mg/dL   Calcium 09.610.9 (H) 8.9 - 10.3 mg/dL   GFR calc non Af Amer >60 >60 mL/min   GFR calc Af Amer >60 >60 mL/min   Anion gap 8 5 - 15  CBC  Result Value Ref Range   WBC 8.3 4.0 - 10.5 K/uL   RBC 4.78 4.22 - 5.81 MIL/uL   Hemoglobin 15.9 13.0 - 17.0 g/dL   HCT 04.544.0 40.939.0 - 81.152.0 %   MCV 92.1 78.0 - 100.0 fL   MCH 33.3 26.0 - 34.0 pg   MCHC 36.1 (H) 30.0 - 36.0 g/dL   RDW 91.412.6 78.211.5 - 95.615.5 %   Platelets 156 150 - 400 K/uL  I-stat troponin, ED  Result Value Ref Range   Troponin i, poc 0.26 (HH) 0.00 - 0.08 ng/mL   Comment NOTIFIED PHYSICIAN    Comment 3           Dg Chest 2 View  Result Date: 12/25/2016 CLINICAL DATA:  Right-sided chest pain for couple of days. EXAM: CHEST  2 VIEW COMPARISON:  None. FINDINGS: Normal heart size and pulmonary vascularity. No focal airspace disease or consolidation in the lungs. No blunting of costophrenic angles. No pneumothorax. Mediastinal contours appear intact. Degenerative changes in the spine. IMPRESSION: No active cardiopulmonary disease. Electronically Signed   By: Burman NievesWilliam  Stevens M.D.   On: 12/25/2016 05:04    EKG  EKG Interpretation  Date/Time:  Wednesday December 25 2016 04:44:04 EST Ventricular Rate:  73 PR Interval:  124 QRS Duration: 114 QT Interval:  396 QTC Calculation: 436 R Axis:   -25 Text Interpretation:  Normal sinus rhythm Minimal voltage criteria for LVH, may be normal variant Confirmed by Nicanor AlconPalumbo, Jolene Guyett ([id number]) on 12/25/2016 4:51:45 AM Also confirmed by Nicanor AlconPalumbo, Millie Forde ([id number]), editor Madalyn RobEverhart, Marilyn [phone]  on 12/25/2016 7:07:29 AM       Radiology Dg Chest 2 View  Result Date: 12/25/2016 CLINICAL DATA:  Right-sided chest pain for couple of days. EXAM: CHEST  2 VIEW COMPARISON:  None. FINDINGS: Normal heart size and pulmonary vascularity. No focal airspace  disease or consolidation in the lungs. No blunting of costophrenic angles. No pneumothorax. Mediastinal contours appear intact. Degenerative changes in the spine. IMPRESSION: No active cardiopulmonary disease. Electronically Signed   By: Burman NievesWilliam  Stevens M.D.   On: 12/25/2016 05:04    Procedures Procedures (including critical care time)  Medications Ordered in ED  Medications  fentaNYL (SUBLIMAZE) 100 MCG/2ML injection (  Not Given 12/25/16 0715)  fentaNYL (SUBLIMAZE) injection 100 mcg (not administered)  nitroGLYCERIN (NITROSTAT) SL tablet 0.4 mg (not administered)  heparin ADULT infusion 100 units/mL (25000 units/22350mL sodium chloride 0.45%) (not administered)  fentaNYL (SUBLIMAZE) injection 50 mcg (50 mcg Intravenous Given 12/25/16 0548)  fentaNYL (SUBLIMAZE) injection 100 mcg (100 mcg Intravenous Given 12/25/16 0702)  iopamidol (ISOVUE-370) 76 % injection (100 mLs  Contrast Given 12/25/16 0737)  ondansetron (ZOFRAN) injection 4 mg (4 mg Intravenous Given 12/25/16 0837)  aspirin chewable tablet 81 mg (81 mg Oral Given 12/25/16 0837)       Final Clinical Impressions(s) / ED Diagnoses   NSTEMI: will admit to cardiology.  Ruled out for PE in the ED.  Cardiology at the bed side.     Neda Willenbring, MD 12/25/16 [phone]

## 2016-12-25 NOTE — ED Notes (Signed)
Pt and family visibly agitated. Pt's wife stated "Do you know how long we have been in here? You need to move him now, this is too long for him to be in here." This RN explained delay and attempted comfort measures. Pt repositioned in bed, monitor wires straightened out, and thermostat adjusted. Pt verbalized increased comfort. Pt and family still agitated and wanting pt to go to a room immediately. Will continue to provide therapeutic communication.

## 2016-12-25 NOTE — Progress Notes (Signed)
ANTICOAGULATION CONSULT NOTE - Initial Consult  Pharmacy Consult for heparin Indication: chest pain/ACS  Allergies  Allergen Reactions  . Morphine And Related     Headache     Patient Measurements: Height: 5\' 10"  (177.8 cm) Weight: 300 lb (136.1 kg) IBW/kg (Calculated) : 73 Heparin Dosing Weight: 104.7 kg  Vital Signs: Temp: 97.8 F (36.6 C) (11/21 0442) Temp Source: Oral (11/21 0442) BP: 128/70 (11/21 1015) Pulse Rate: 57 (11/21 1015)  Labs: Recent Labs    12/25/16 0446 12/25/16 0724  HGB 15.9  --   HCT 44.0  --   PLT 156  --   CREATININE 0.89  --   TROPONINI  --  0.80*    Estimated Creatinine Clearance: 139.5 mL/min (by C-G formula based on SCr of 0.89 mg/dL).   Medical History: Past Medical History:  Diagnosis Date  . Hypertension     Assessment: 51 yo male admitted with chest pain, which began on 11/19. Found to be hypertensive with positive troponin. CTA neg for PE. Starting heparin gtt for rule out ACS. SCr 0.8, CBC wnl.  Goal of Therapy:  Heparin level 0.3-0.7 units/ml Monitor platelets by anticoagulation protocol: Yes   Plan:  -Heparin bolus 4000 units x1 then 1350 units/hr -Daily HL, CBC -Level this afternoon -F/u cardiology plans   [name], Fred Maxwell 12/25/2016,10:26 AM

## 2016-12-25 NOTE — ED Notes (Signed)
Pt taken to radiology. Will return to room

## 2016-12-25 NOTE — Progress Notes (Signed)
Pt and family asked that cath be explained in more detail which I did.  The patient understands that risks included but are not limited to stroke (1 in 1000), death (1 in 1000), kidney failure [usually temporary] (1 in 500), bleeding (1 in 200), allergic reaction [possibly serious] (1 in 200).  The patient understands and agrees to proceed.   Corine ShelterLUKE Jerric Oyen PA-C 12/25/2016 2:49 PM

## 2016-12-25 NOTE — ED Notes (Signed)
Cardiology paged for admission orders 

## 2016-12-25 NOTE — H&P (Signed)
Cardiology Consultation:   Patient ID: Fred Maxwell; [id number]; 04/28/1965   Admit date: 12/25/2016 Date of Consult: 12/25/2016  Primary Care Provider: Clayborn Heronankins, Victoria R, MD Primary Cardiologist: New  Patient Profile:   Fred Maxwell is a 51 y.o. male with a hx of GERD, gout, joint pain, obesity, family history of early coronary artery disease, hx of tobacco use who is being seen today for the evaluation of chest pain at the request of Dr. Nicanor AlconPalumbo.  History of Present Illness:   Fred Maxwell began having chest pain which woke him from sleep at 1 am on Monday 11/19. He got out of bed, took Tums, and walked around and the chest pain resolved to the point where he was back in bed an hour later.  The pain reoccurred yesterday afternoon when he was cooking after mowing his lawn and again relieved in about an hour.  This morning at 1:30 AM he was woken up by the pain is been constant since that time.  The pain is like a pressure over the right side of his chest and radiates down to his right arm to his wrist and up the right side of his neck. It is associated with nausea, burping and anxiety. It was relieved with fentanyl and rest in the ED. It was exacerbated by movement. He denies diaphoresis, the pain is not made worse with deep breathing and is not reproducible on palpation of his chest. He received nitroglycerine while this history was being taken and the pain acutely became much worse.  He notes that he does have joint pain and his right shoulder has been bothering him recently so he had a corticosteroid shot.  His wife notes that he is not a complainer. He has never had chest pain like this in the past.   In the ED he was found to have an initial blood pressure of 193/120, troponin 0.26 > 0.80 two hours later, EKG was sinus rhythm with mini q waves in leads I and aVL. CT angio chest did not reveal any large proximal PE.   Past Medical History:  Diagnosis Date  . Hypertension      History reviewed. No pertinent surgical history.   Home Medications:  Prior to Admission medications   Medication Sig Start Date End Date Taking? Authorizing Provider  fluticasone (FLONASE) 50 MCG/ACT nasal spray Place 2 sprays into both nostrils daily as needed for allergies or rhinitis.   Yes [provider]  ibuprofen (ADVIL,MOTRIN) 200 MG tablet Take 600-800 mg by mouth every 6 (six) hours as needed for headache or mild pain.   Yes [provider]  irbesartan (AVAPRO) 150 MG tablet Take 150 mg by mouth daily.   Yes [provider]   Allergies:    Allergies  Allergen Reactions  . Morphine And Related     Headache     Social History:   Smoking history: 20 pack year smoking history, quit smoking 15 years ago  ETOH history: drinks one beer about twice per week at dinner  Denies recent illicit drug use   Family History:   Mother - CABG x2 at age 49, Maternal grandmother with MI at age 49  ROS:  Please see the history of present illness. All other ROS reviewed and negative.     Physical Exam/Data:   Vitals:   12/25/16 0630 12/25/16 0700 12/25/16 0730 12/25/16 0830  BP: (!) 146/81 (!) 186/100 (!) 160/81 (!) 185/115  Pulse: 60 62 60 69  Resp: 17  15 17 18   Temp:      TempSrc:      SpO2: 97% 98% 95% 99%   No intake or output data in the 24 hours ending 12/25/16 0857 There were no vitals filed for this visit. There is no height or weight on file to calculate BMI.  General:  Obese, Well nourished, well developed, appears uncomfortably  HEENT: normal Lymph: no adenopathy Neck: no JVD Endocrine:  No thryomegaly Cardiac:  normal S1, S2; RRR; no murmur, no tenderness with palpation over right chest wall  Lungs:  clear to auscultation bilaterally, no wheezing, rhonchi or rales  Abd: soft, nontender, no hepatomegaly  Ext: no edema Musculoskeletal:  No deformities, BUE and BLE strength normal and equal, tenderness with palpation over the right  shoulder joint  Skin: warm and dry  Neuro:  CN grossly intact, moving all extremities  Psych:  Normal affect   EKG:  The EKG was personally reviewed and demonstrates:  Sinus rhythm, heart rate 72, mini q waves in I and aVL.  Telemetry:  Telemetry was personally reviewed and demonstrates:  Sinus rhythm  Relevant CV Studies:  CT angio chest 12/25/2016 Cardiovascular:  Heart:  No cardiomegaly. No pericardial fluid/thickening. No significant coronary calcifications. Minimal aortic valve calcifications.  Aorta:  Unremarkable course, caliber, contour of the thoracic aorta. No aneurysm or dissection flap. No periaortic fluid.  Pulmonary arteries:  No central, lobar, segmental, or proximal subsegmental filling defects.  Mediastinum/Nodes: Mediastinal lymph nodes are present, none of which are enlarged by CT size criteria. Unremarkable appearance of the thoracic esophagus.  Unremarkable appearance of the thoracic inlet and thyroid.  Lungs/Pleura: No confluent airspace disease, pneumothorax, or pleural effusion. No endotracheal or endobronchial debris. No bronchial wall thickening. Atelectasis, accounting for mild geographic ground-glass appearance of the lungs.  Upper Abdomen: Unremarkable.  Musculoskeletal: No displaced fracture. No significant degenerative changes of the spine.  Review of the MIP images confirms the above findings.  IMPRESSION: Study is negative for pulmonary emboli. No acute CT finding to account for chest pain.  Laboratory Data:  Chemistry Recent Labs  Lab 12/25/16 0446  NA 138  K 4.0  CL 104  CO2 26  GLUCOSE 132*  BUN 10  CREATININE 0.89  CALCIUM 10.9*  GFRNONAA >60  GFRAA >60  ANIONGAP 8    No results for input(s): PROT, ALBUMIN, AST, ALT, ALKPHOS, BILITOT in the last 168 hours. Hematology Recent Labs  Lab 12/25/16 0446  WBC 8.3  RBC 4.78  HGB 15.9  HCT 44.0  MCV 92.1  MCH 33.3  MCHC 36.1*  RDW 12.6  PLT 156     Cardiac EnzymesNo results for input(s): TROPONINI in the last 168 hours.  Recent Labs  Lab 12/25/16 0518  TROPIPOC 0.26*    BNPNo results for input(s): BNP, PROBNP in the last 168 hours.  DDimer No results for input(s): DDIMER in the last 168 hours.  Radiology/Studies:  Dg Chest 2 View  Result Date: 12/25/2016 CLINICAL DATA:  Right-sided chest pain for couple of days. EXAM: CHEST  2 VIEW COMPARISON:  None. FINDINGS: Normal heart size and pulmonary vascularity. No focal airspace disease or consolidation in the lungs. No blunting of costophrenic angles. No pneumothorax. Mediastinal contours appear intact. Degenerative changes in the spine. IMPRESSION: No active cardiopulmonary disease. Electronically Signed   By: Burman NievesWilliam  Stevens M.D.   On: 12/25/2016 05:04   Ct Angio Chest Pe W And/or Wo Contrast  Result Date: 12/25/2016 CLINICAL DATA:  51 year old male with a history  of chest pain for about 1 week. EXAM: CT ANGIOGRAPHY CHEST WITH CONTRAST TECHNIQUE: Multidetector CT imaging of the chest was performed using the standard protocol during bolus administration of intravenous contrast. Multiplanar CT image reconstructions and MIPs were obtained to evaluate the vascular anatomy. CONTRAST:  ISOVUE-370 IOPAMIDOL (ISOVUE-370) INJECTION 76% COMPARISON:  Chest x-ray 12/25/2016 FINDINGS: Cardiovascular: Heart: No cardiomegaly. No pericardial fluid/thickening. No significant coronary calcifications. Minimal aortic valve calcifications. Aorta: Unremarkable course, caliber, contour of the thoracic aorta. No aneurysm or dissection flap. No periaortic fluid. Pulmonary arteries: No central, lobar, segmental, or proximal subsegmental filling defects. Mediastinum/Nodes: Mediastinal lymph nodes are present, none of which are enlarged by CT size criteria. Unremarkable appearance of the thoracic esophagus. Unremarkable appearance of the thoracic inlet and thyroid. Lungs/Pleura: No confluent airspace disease,  pneumothorax, or pleural effusion. No endotracheal or endobronchial debris. No bronchial wall thickening. Atelectasis, accounting for mild geographic ground-glass appearance of the lungs. Upper Abdomen: Unremarkable. Musculoskeletal: No displaced fracture. No significant degenerative changes of the spine. Review of the MIP images confirms the above findings. IMPRESSION: Study is negative for pulmonary emboli. No acute CT finding to account for chest pain. Electronically Signed   By: Gilmer Mor D.O.   On: 12/25/2016 08:25    Assessment and Plan:   1. NSTEMI - PMH concerning for uncontrolled hypertension, morbid obesity, and family history of coronary artery disease at young age. The right sided chest pain has atypical and concerning features being that it woke him from sleep and is worse with exertion. It is unusual that his chest pain became worse with nitroglycerine. Initial troponin was 0.26 then increased to 0.8 two hours later and initial EKG is not consistent with acute ischemia. His HEART score is 7, making him high risk for ACS. Will repeat an EKG now and start heparin gtt. Ordered lipid panel and A1c.  2. Hypertensive urgency - Denying neurologic symptoms of end organ damage but blood pressure was not controlled at presentation and remains elevated. Question if this may be worse because of his current pain but will follow closely and make sure that he is on the right medications prior to discharge.    For questions or updates, please contact [facility] Please consult [email or web address] for contact info under Cardiology/STEMI.   Signed, Eulah Pont, MD  12/25/2016 8:57 AM   Patient seen and examined  I agree with findings as noted by Glade Nurse above  Pt is a 51 yo with history of HTN, Obesity, FHx of CAD who presents weith R sided chest pain Pt on arrival to ED very hypertensive  BP improved significantly with NTG On exam:  JVP normal  Lungs CTA  Cardac RRR  No S3  Abd Benign.  Chest   nontender  Ext without edema EKG is nondiagnositic.     Initial two troponins positive  Recom:  With history above and troponins would recomm L heart cath to define anatomy Start Heparin, ASA , NTG   Will need statin  May need evaluation for sleep apnea    Dietrich Pates

## 2016-12-25 NOTE — ED Notes (Signed)
Per cardiologist, will notify resident to place admission orders.

## 2016-12-25 NOTE — ED Notes (Signed)
When attempting to get consent form signed, pt and family reports no knowledge of plan for catheterization. This RN spoke with cardiology, Franky MachoLuke to come down to speak with pt and family about procedure.

## 2016-12-25 NOTE — ED Notes (Signed)
Patient transported to CT 

## 2016-12-25 NOTE — ED Notes (Signed)
Electronic consent form signed by patient and witnessed by this RN. Located under "informed consents" tab.

## 2016-12-25 NOTE — Interval H&P Note (Signed)
History and Physical Interval Note:  12/25/2016 3:34 PM  Fred Maxwell  has presented today for surgery, with the diagnosis of NSTEMI.   The various methods of treatment have been discussed with the patient and family. After consideration of risks, benefits and other options for treatment, the patient has consented to  Procedure(s): LEFT HEART CATH AND CORONARY ANGIOGRAPHY (N/A) as a surgical intervention .  The patient's history has been reviewed, patient examined, no change in status, stable for surgery.  I have reviewed the patient's chart and labs.  Questions were answered to the patient's satisfaction.     Lorine BearsMuhammad Maryana Pittmon

## 2016-12-25 NOTE — H&P (View-Only) (Signed)
Pt and family asked that cath be explained in more detail which I did.  The patient understands that risks included but are not limited to stroke (1 in 1000), death (1 in 1000), kidney failure [usually temporary] (1 in 500), bleeding (1 in 200), allergic reaction [possibly serious] (1 in 200).  The patient understands and agrees to proceed.   Kristyn Obyrne PA-C 12/25/2016 2:49 PM   

## 2016-12-26 LAB — COMPREHENSIVE METABOLIC PANEL
ALT: 38 U/L (ref 17–63)
AST: 91 U/L — ABNORMAL HIGH (ref 15–41)
Albumin: 3.7 g/dL (ref 3.5–5.0)
Alkaline Phosphatase: 49 U/L (ref 38–126)
Anion gap: 9 (ref 5–15)
BUN: 8 mg/dL (ref 6–20)
CO2: 23 mmol/L (ref 22–32)
Calcium: 9.1 mg/dL (ref 8.9–10.3)
Chloride: 105 mmol/L (ref 101–111)
Creatinine, Ser: 0.92 mg/dL (ref 0.61–1.24)
GFR calc Af Amer: >60 mL/min (ref >60)
GFR calc non Af Amer: >60 mL/min (ref >60)
Glucose, Bld: 101 mg/dL — ABNORMAL HIGH (ref 65–99)
Potassium: 4 mmol/L (ref 3.5–5.1)
Sodium: 137 mmol/L (ref 135–145)
Total Bilirubin: 1.2 mg/dL (ref 0.3–1.2)
Total Protein: 6.2 g/dL — ABNORMAL LOW (ref 6.5–8.1)

## 2016-12-26 LAB — HEMOGLOBIN A1C
Hgb A1c MFr Bld: 5.5 % (ref 4.8–5.6)
Mean Plasma Glucose: 111.15 mg/dL

## 2016-12-26 LAB — CBC
HCT: 41.7 % (ref 39.0–52.0)
Hemoglobin: 14.3 g/dL (ref 13.0–17.0)
MCH: 32.3 pg (ref 26.0–34.0)
MCHC: 34.3 g/dL (ref 30.0–36.0)
MCV: 94.1 fL (ref 78.0–100.0)
Platelets: 154 10*3/uL (ref 150–400)
RBC: 4.43 MIL/uL (ref 4.22–5.81)
RDW: 13.1 % (ref 11.5–15.5)
WBC: 11.4 10*3/uL — ABNORMAL HIGH (ref 4.0–10.5)

## 2016-12-26 LAB — HIV ANTIBODY (ROUTINE TESTING W REFLEX): HIV Screen 4th Generation wRfx: NONREACTIVE

## 2016-12-26 LAB — LIPID PANEL
Cholesterol: 154 mg/dL (ref 0–200)
HDL: 32 mg/dL — ABNORMAL LOW (ref >40)
LDL Cholesterol: 95 mg/dL (ref 0–99)
Total CHOL/HDL Ratio: 4.8 RATIO
Triglycerides: 134 mg/dL (ref <150)
VLDL: 27 mg/dL (ref 0–40)

## 2016-12-26 LAB — TROPONIN I: Troponin I: 15.73 ng/mL (ref <0.03)

## 2016-12-26 MED ORDER — IRBESARTAN 300 MG PO TABS
300.0000 mg | ORAL_TABLET | Freq: Every day | ORAL | Status: DC
  Administered 2016-12-26 – 2016-12-27 (×2): 300 mg via ORAL
  Filled 2016-12-26 (×3): qty 1

## 2016-12-26 MED ORDER — HYDRALAZINE HCL 50 MG PO TABS
50.0000 mg | ORAL_TABLET | Freq: Four times a day (QID) | ORAL | Status: DC | PRN
Start: 2016-12-26 — End: 2016-12-27

## 2016-12-26 MED ORDER — METOPROLOL TARTRATE 50 MG PO TABS
50.0000 mg | ORAL_TABLET | Freq: Two times a day (BID) | ORAL | Status: DC
  Administered 2016-12-26 (×2): 50 mg via ORAL
  Filled 2016-12-26 (×2): qty 1

## 2016-12-26 NOTE — Progress Notes (Signed)
Progress Note  Patient Name: Fred Maxwell Date of Encounter: 12/26/2016  Primary Cardiologist: Ross/Arida  Subjective   No chest pain some headache  Inpatient Medications    Scheduled Meds: . aspirin EC  81 mg Oral Daily  . atorvastatin  80 mg Oral q1800  . enoxaparin (LOVENOX) injection  40 mg Subcutaneous Q24H  . Influenza vac split quadrivalent PF  0.5 mL Intramuscular Tomorrow-1000  . irbesartan  150 mg Oral Daily  . metoprolol tartrate  25 mg Oral BID  . pantoprazole (PROTONIX) IV  40 mg Intravenous QHS  . pneumococcal 23 valent vaccine  0.5 mL Intramuscular Tomorrow-1000  . sodium chloride flush  3 mL Intravenous Q12H  . ticagrelor  90 mg Oral BID   Continuous Infusions: . sodium chloride    . sodium chloride     PRN Meds: sodium chloride, sodium chloride, acetaminophen, ALPRAZolam, fluticasone, labetalol, ondansetron (ZOFRAN) IV, sodium chloride flush, zolpidem   Vital Signs    Vitals:   12/26/16 0500 12/26/16 0530 12/26/16 0600 12/26/16 0700  BP: (!) 142/58 (!) 156/86 (!) 166/80 (!) 194/111  Pulse: 70 74 71 64  Resp:   18 13  Temp:   98.3 F (36.8 C)   TempSrc:   Oral   SpO2: 96% 96% 96% 96%  Weight: 283 lb 4.7 oz (128.5 kg)     Height:        Intake/Output Summary (Last 24 hours) at 12/26/2016 0735 Last data filed at 12/26/2016 0600 Gross per 24 hour  Intake 1038.2 ml  Output 975 ml  Net 63.2 ml   Filed Weights   12/25/16 1015 12/26/16 0500  Weight: 300 lb (136.1 kg) 283 lb 4.7 oz (128.5 kg)    Telemetry    NSR no arrhythmia 12/26/2016  - Personally Reviewed  ECG    SR no acute changes  - Personally Reviewed  Physical Exam  Obese white male Radial band still on  GEN: No acute distress.   Neck: No JVD Cardiac: RRR, no murmurs, rubs, or gallops.  Respiratory: Clear to auscultation bilaterally. GI: Soft, nontender, non-distended  MS: No edema; No deformity. Neuro:  Nonfocal  Psych: Normal affect   Labs    Chemistry Recent  Labs  Lab 12/25/16 0446 12/26/16 0010  NA 138 137  K 4.0 4.0  CL 104 105  CO2 26 23  GLUCOSE 132* 101*  BUN 10 8  CREATININE 0.89 0.92  CALCIUM 10.9* 9.1  PROT  --  6.2*  ALBUMIN  --  3.7  AST  --  91*  ALT  --  38  ALKPHOS  --  49  BILITOT  --  1.2  GFRNONAA >60 >60  GFRAA >60 >60  ANIONGAP 8 9     Hematology Recent Labs  Lab 12/25/16 0446 12/26/16 0010  WBC 8.3 11.4*  RBC 4.78 4.43  HGB 15.9 14.3  HCT 44.0 41.7  MCV 92.1 94.1  MCH 33.3 32.3  MCHC 36.1* 34.3  RDW 12.6 13.1  PLT 156 154    Cardiac Enzymes Recent Labs  Lab 12/25/16 0724 12/25/16 1321 12/25/16 1930 12/26/16 0010  TROPONINI 0.80* 4.28* 34.41* 15.73*    Recent Labs  Lab 12/25/16 0518  TROPIPOC 0.26*     BNPNo results for input(s): BNP, PROBNP in the last 168 hours.   DDimer No results for input(s): DDIMER in the last 168 hours.   Radiology    Dg Chest 2 View  Result Date: 12/25/2016 CLINICAL DATA:  Right-sided chest  pain for couple of days. EXAM: CHEST  2 VIEW COMPARISON:  None. FINDINGS: Normal heart size and pulmonary vascularity. No focal airspace disease or consolidation in the lungs. No blunting of costophrenic angles. No pneumothorax. Mediastinal contours appear intact. Degenerative changes in the spine. IMPRESSION: No active cardiopulmonary disease. Electronically Signed   By: Burman NievesWilliam  Stevens M.D.   On: 12/25/2016 05:04   Ct Angio Chest Pe W And/or Wo Contrast  Result Date: 12/25/2016 CLINICAL DATA:  51 year old male with a history of chest pain for about 1 week. EXAM: CT ANGIOGRAPHY CHEST WITH CONTRAST TECHNIQUE: Multidetector CT imaging of the chest was performed using the standard protocol during bolus administration of intravenous contrast. Multiplanar CT image reconstructions and MIPs were obtained to evaluate the vascular anatomy. CONTRAST:  100mL ISOVUE-370 IOPAMIDOL (ISOVUE-370) INJECTION 76% COMPARISON:  Chest x-ray 12/25/2016 FINDINGS: Cardiovascular: Heart: No  cardiomegaly. No pericardial fluid/thickening. No significant coronary calcifications. Minimal aortic valve calcifications. Aorta: Unremarkable course, caliber, contour of the thoracic aorta. No aneurysm or dissection flap. No periaortic fluid. Pulmonary arteries: No central, lobar, segmental, or proximal subsegmental filling defects. Mediastinum/Nodes: Mediastinal lymph nodes are present, none of which are enlarged by CT size criteria. Unremarkable appearance of the thoracic esophagus. Unremarkable appearance of the thoracic inlet and thyroid. Lungs/Pleura: No confluent airspace disease, pneumothorax, or pleural effusion. No endotracheal or endobronchial debris. No bronchial wall thickening. Atelectasis, accounting for mild geographic ground-glass appearance of the lungs. Upper Abdomen: Unremarkable. Musculoskeletal: No displaced fracture. No significant degenerative changes of the spine. Review of the MIP images confirms the above findings. IMPRESSION: Study is negative for pulmonary emboli. No acute CT finding to account for chest pain. Electronically Signed   By: Gilmer MorJaime  Wagner D.O.   On: 12/25/2016 08:25    Cardiac Studies   Cath with occluded OM2 and diffuse disease in PDA films reviewed  Patient Profile     Fred Maxwell is a 51 y.o. male with a hx of GERD, gout, joint pain, obesity, family history of early coronary artery disease, hx of tobacco use Presented with SEMI and chest pain Occluded OM2 stented by Dr Kirke CorinArida Residual diffuse PL2 disease Rx medically EF normal    Assessment & Plan    1) SEMI:  Continue beta blocker and DAT no chest pain ECG normal 2) HTN.  Increase ARB and beta blocker add hydralazine PRN 3) Smoking counseled on cessation for less than 10 mintues  Ambulate depending on BP may be ready for d/c in am  Will d/c radial band   For questions or updates, please contact [facility] Please consult [email or web address] for contact info under Cardiology/STEMI.       Signed, Charlton HawsPeter Charvis Lightner, MD  12/26/2016, 7:35 AM

## 2016-12-27 ENCOUNTER — Encounter (HOSPITAL_COMMUNITY): Payer: Self-pay | Admitting: Cardiovascular Disease

## 2016-12-27 LAB — CBC
HCT: 40.5 % (ref 39.0–52.0)
Hemoglobin: 13.8 g/dL (ref 13.0–17.0)
MCH: 32.5 pg (ref 26.0–34.0)
MCHC: 34.1 g/dL (ref 30.0–36.0)
MCV: 95.5 fL (ref 78.0–100.0)
Platelets: 122 10*3/uL — ABNORMAL LOW (ref 150–400)
RBC: 4.24 MIL/uL (ref 4.22–5.81)
RDW: 13.3 % (ref 11.5–15.5)
WBC: 8.6 10*3/uL (ref 4.0–10.5)

## 2016-12-27 LAB — TROPONIN I: Troponin I: 6.1 ng/mL (ref <0.03)

## 2016-12-27 MED ORDER — METOPROLOL TARTRATE 50 MG PO TABS
100.0000 mg | ORAL_TABLET | Freq: Two times a day (BID) | ORAL | Status: DC
Start: 2016-12-27 — End: 2016-12-27
  Administered 2016-12-27: 100 mg via ORAL
  Filled 2016-12-27: qty 2

## 2016-12-27 MED ORDER — ATORVASTATIN CALCIUM 80 MG PO TABS: 80.0000 mg | ORAL_TABLET | Freq: Every day | ORAL | 3 refills | Status: DC

## 2016-12-27 MED ORDER — IRBESARTAN 300 MG PO TABS: 300.0000 mg | ORAL_TABLET | Freq: Every day | ORAL | 3 refills | Status: DC

## 2016-12-27 MED ORDER — TICAGRELOR 90 MG PO TABS: 90.0000 mg | ORAL_TABLET | Freq: Two times a day (BID) | ORAL | 11 refills | Status: DC

## 2016-12-27 MED ORDER — METOPROLOL TARTRATE 50 MG PO TABS: 75.0000 mg | ORAL_TABLET | Freq: Two times a day (BID) | ORAL | Status: DC

## 2016-12-27 MED ORDER — NITROGLYCERIN 0.4 MG SL SUBL: 0.4000 mg | SUBLINGUAL_TABLET | SUBLINGUAL | 12 refills | Status: AC | PRN

## 2016-12-27 MED ORDER — PANTOPRAZOLE SODIUM 40 MG PO TBEC: 40.0000 mg | DELAYED_RELEASE_TABLET | Freq: Every day | ORAL | Status: DC

## 2016-12-27 MED ORDER — METOPROLOL TARTRATE 75 MG PO TABS: 75.0000 mg | ORAL_TABLET | Freq: Two times a day (BID) | ORAL | 3 refills | Status: DC

## 2016-12-27 MED ORDER — ASPIRIN 81 MG PO TBEC: 81.0000 mg | DELAYED_RELEASE_TABLET | Freq: Every day | ORAL | 3 refills | Status: AC

## 2016-12-27 NOTE — Progress Notes (Signed)
Cardiac Rehab 1030-1145 Pt walking independently in hall, denies any SOB or pain. Completed MI and stent education with pt and wife. We discussed MI, stent, Brilinta, ASA, heart healthy diet and ways to lose weight, exercise guidelines, proper use of SL NTG, calling 911 and Outpt. CRP. He voices understanding. Referral sent to [place] [facility]. CRP. Pt seems very motivated to making changes and improving his risk factors for heart disease.

## 2016-12-27 NOTE — Care Management Note (Addendum)
Case Management Note Donn PieriniKristi Maylie Ashton RN, BSN Unit 4E-Case Manager--2Hcoverage [phone]  Patient Details  Name: Fred PihDarren Maxwell MRN: [id number] Date of Birth: 10/28/1965  Subjective/Objective:    Pt admitted with NSTEMI s/p DES                Action/Plan: PTA pt lived at home with spouse- noted pt has been started on Brilinta- due to holiday CM unable to check copay for Brilinta- will provide pt will copay assist card to use on discharge. - on speaking with pt at bedside pt plans to use [facility] on [address]  Expected Discharge Date:  12/26/16               Expected Discharge Plan:   home/self care  In-House Referral:     Discharge planning Services  CM Consult, Medication Assistance  Post Acute Care Choice:   NA Choice offered to:   NA  DME Arranged:    DME Agency:     HH Arranged:    HH Agency:     Status of Service:  In process, will continue to follow  If discussed at Long Length of Stay Meetings, dates discussed:   Discharge Disposition: home/self care   Additional Comments:  Darrold SpanWebster, Contessa Preuss Hall, RN 12/27/2016, 10:03 AM

## 2016-12-27 NOTE — Progress Notes (Addendum)
Progress Note  Patient Name: Fred Maxwell Date of Encounter: 12/27/2016  Primary Cardiologist: New   Subjective   Feels that he has been having small panic episodes which last less than a minute and are not associated palpitations, these began after the catheterization, they were worse when he closed his eyes and have become progressively less frequent   Inpatient Medications    Scheduled Meds: . aspirin EC  81 mg Oral Daily  . atorvastatin  80 mg Oral q1800  . enoxaparin (LOVENOX) injection  40 mg Subcutaneous Q24H  . irbesartan  300 mg Oral Daily  . metoprolol tartrate  50 mg Oral BID  . pantoprazole (PROTONIX) IV  40 mg Intravenous QHS  . sodium chloride flush  3 mL Intravenous Q12H  . ticagrelor  90 mg Oral BID   Continuous Infusions: . sodium chloride    . sodium chloride     PRN Meds: sodium chloride, sodium chloride, acetaminophen, ALPRAZolam, fluticasone, hydrALAZINE, ondansetron (ZOFRAN) IV, sodium chloride flush, zolpidem   Vital Signs    Vitals:   12/27/16 0500 12/27/16 0512 12/27/16 0600 12/27/16 0700  BP:  (!) 154/89    Pulse:      Resp: (!) 29 (!) 21 (!) 21 19  Temp:      TempSrc:      SpO2:      Weight: 291 lb 8 oz (132.2 kg)     Height:        Intake/Output Summary (Last 24 hours) at 12/27/2016 0756 Last data filed at 12/27/2016 0500 Gross per 24 hour  Intake 1620 ml  Output -  Net 1620 ml   Filed Weights   12/25/16 1015 12/26/16 0500 12/27/16 0500  Weight: 300 lb (136.1 kg) 283 lb 4.7 oz (128.5 kg) 291 lb 8 oz (132.2 kg)    Telemetry    Sinus rhythm, HR 70s - Personally Reviewed  ECG    Sinus rhythm, rate 66, t wave inversions in anterior,  - Personally Reviewed  Physical Exam   GEN: morbidly obese, resting comfortably in bed speaking with wifeNo acute distress.   Neck: No JVD Cardiac: distant sounds, RRR, grade 1 systolic murmur murmurs, no rubs, or gallops.  Respiratory: decreased air movement  GI: BS+ Soft, nontender,  non-distended  MS: No edema; No deformity. Neuro:  Nonfocal  Psych: Normal affect   Labs    Chemistry Recent Labs  Lab 12/25/16 0446 12/26/16 0010  NA 138 137  K 4.0 4.0  CL 104 105  CO2 26 23  GLUCOSE 132* 101*  BUN 10 8  CREATININE 0.89 0.92  CALCIUM 10.9* 9.1  PROT  --  6.2*  ALBUMIN  --  3.7  AST  --  91*  ALT  --  38  ALKPHOS  --  49  BILITOT  --  1.2  GFRNONAA >60 >60  GFRAA >60 >60  ANIONGAP 8 9     Hematology Recent Labs  Lab 12/25/16 0446 12/26/16 0010 12/27/16 0325  WBC 8.3 11.4* 8.6  RBC 4.78 4.43 4.24  HGB 15.9 14.3 13.8  HCT 44.0 41.7 40.5  MCV 92.1 94.1 95.5  MCH 33.3 32.3 32.5  MCHC 36.1* 34.3 34.1  RDW 12.6 13.1 13.3  PLT 156 154 122*    Cardiac Enzymes Recent Labs  Lab 12/25/16 1321 12/25/16 1930 12/26/16 0010 12/27/16 0325  TROPONINI 4.28* 34.41* 15.73* 6.10*    Recent Labs  Lab 12/25/16 0518  TROPIPOC 0.26*     BNPNo results for  input(s): BNP, PROBNP in the last 168 hours.   DDimer No results for input(s): DDIMER in the last 168 hours.   Radiology    Ct Angio Chest Pe W And/or Wo Contrast  Result Date: 12/25/2016 CLINICAL DATA:  51 year old male with a history of chest pain for about 1 week. EXAM: CT ANGIOGRAPHY CHEST WITH CONTRAST TECHNIQUE: Multidetector CT imaging of the chest was performed using the standard protocol during bolus administration of intravenous contrast. Multiplanar CT image reconstructions and MIPs were obtained to evaluate the vascular anatomy. CONTRAST:  100mL ISOVUE-370 IOPAMIDOL (ISOVUE-370) INJECTION 76% COMPARISON:  Chest x-ray 12/25/2016 FINDINGS: Cardiovascular: Heart: No cardiomegaly. No pericardial fluid/thickening. No significant coronary calcifications. Minimal aortic valve calcifications. Aorta: Unremarkable course, caliber, contour of the thoracic aorta. No aneurysm or dissection flap. No periaortic fluid. Pulmonary arteries: No central, lobar, segmental, or proximal subsegmental filling  defects. Mediastinum/Nodes: Mediastinal lymph nodes are present, none of which are enlarged by CT size criteria. Unremarkable appearance of the thoracic esophagus. Unremarkable appearance of the thoracic inlet and thyroid. Lungs/Pleura: No confluent airspace disease, pneumothorax, or pleural effusion. No endotracheal or endobronchial debris. No bronchial wall thickening. Atelectasis, accounting for mild geographic ground-glass appearance of the lungs. Upper Abdomen: Unremarkable. Musculoskeletal: No displaced fracture. No significant degenerative changes of the spine. Review of the MIP images confirms the above findings. IMPRESSION: Study is negative for pulmonary emboli. No acute CT finding to account for chest pain. Electronically Signed   By: Gilmer MorJaime  Wagner D.O.   On: 12/25/2016 08:25    Cardiac Studies    2nd RPLB lesion is 85% stenosed.  Ost 2nd Mrg to 2nd Mrg lesion is 100% stenosed.  Prox LAD lesion is 40% stenosed.  The left ventricular systolic function is normal.  LV end diastolic pressure is mildly elevated.  The left ventricular ejection fraction is 55-65% by visual estimate.  A drug-eluting stent was successfully placed using a STENT RESOLUTE ONYX 2.0X30.  Post intervention, there is a 0% residual stenosis.  2nd Mrg lesion is 40% stenosed.   1.  Significant two-vessel coronary artery disease with an occluded ostial OM 2 which is the culprit for myocardial infarction.  There is significant stenosis in ostial RPL2 in a very tortuous segment.  Moderate proximal LAD disease. 2.  Normal left ventricular systolic function and mildly elevated left ventricular end-diastolic pressure. 3.  Successful angioplasty and drug-eluting stent placement to OM 2.  The vessel was overall small and diffusely diseased and required a 2 mm drug-eluting stent.  Recommendations: Dual antiplatelet therapy for at least one year.  Aggressive treatment of risk factors and cardiac rehab. RPL2 is not  suitable for PCI given tortuous segment.  The rest of his coronary artery disease should be treated medically.  Patient Profile     51 y.o. male malewith a hx of GERD, gout, joint pain,obesity,family history of earlycoronary artery disease, hx of tobacco usePresented with NSTEMI with troponin peak 34.4 and was taken to the cath lab emergently. Occluded OM2 stented by Dr Kirke CorinArida with Residual diffuse PL2 disease Rx medically EF normal   Assessment & Plan    NSTEMI now with DES to OM- optimizing medication mgmt - aspirin, ticagrelor, atorvastatin 80 mg, metoprolol tartrate 50 mg BID, and irbesartan 300 mg qd. HR 70s and remains elevated, will increase the metoprolol.    Hypertension - increased metoprolol, continue irbesartan 150 mg qd  Tobacco use - continued counseling on cessation   Hyperlipidemia - LDL 95, initiated statin this admission  Obesity - initiating cardiac rehab   ? Sleep apnea - will need sleep study outpatient   For questions or updates, please contact [facility] Please consult [email or web address] for contact info under Cardiology/STEMI.      Signed, Eulah PontNina Blum, MD  12/27/2016, 7:56 AM    Patient seen and examined  DOing good  No CP  S/P NSTEMI with PTCA/DES to OM    Lungs are clear  Cardiac RRR  No S3  Abdomen is benign  Ext without edema  PlanL  ASA /Brilinta for 1 year   Lipitor Metoprolol added  Continue ARB  OK to d/c today  Will arrange f/u for 2 wks   Keep off tobacco.

## 2016-12-27 NOTE — Discharge Summary (Signed)
Discharge Summary    Patient ID: Fred Maxwell,  MRN: [id number], DOB/AGE: Feb 28, 1965 51 y.o.  Admit date: 12/25/2016 Discharge date: 12/27/2016  Primary Care Provider: Clayborn Heron Primary Cardiologist: New Dr Tenny Craw  Discharge Diagnoses    Active Problems:   NSTEMI (non-ST elevated myocardial infarction) [facility])   Allergies Allergies  Allergen Reactions  . Morphine And Related     Headache     Diagnostic Studies/Procedures      LEFT HEART CATH AND CORONARY ANGIOGRAPHY  12/25/2016  Conclusion    2nd RPLB lesion is 85% stenosed.  Ost 2nd Mrg to 2nd Mrg lesion is 100% stenosed.  Prox LAD lesion is 40% stenosed.  The left ventricular systolic function is normal.  LV end diastolic pressure is mildly elevated.  The left ventricular ejection fraction is 55-65% by visual estimate.  A drug-eluting stent was successfully placed using a STENT RESOLUTE ONYX 2.0X30.  Post intervention, there is a 0% residual stenosis.  2nd Mrg lesion is 40% stenosed.   1.  Significant two-vessel coronary artery disease with an occluded ostial OM 2 which is the culprit for myocardial infarction.  There is significant stenosis in ostial RPL2 in a very tortuous segment.  Moderate proximal LAD disease. 2.  Normal left ventricular systolic function and mildly elevated left ventricular end-diastolic pressure. 3.  Successful angioplasty and drug-eluting stent placement to OM 2.  The vessel was overall small and diffusely diseased and required a 2 mm drug-eluting stent.  Recommendations: Dual antiplatelet therapy for at least one year.  Aggressive treatment of risk factors and cardiac rehab. RPL2 is not suitable for PCI given tortuous segment.  The rest of his coronary artery disease should be treated medically.   Coronary Diagrams   Diagnostic Diagram       Post-Intervention Diagram       _____________   History of Present Illness     Fred Maxwell is a 51 y.o.  male with a hx of GERD, gout, joint pain, obesity, family history of early coronary artery disease, hx of tobacco use who presented for the evaluation of chest pain on 12/25/2016.   Mr. Fred Maxwell began having chest pain which woke him from sleep at 1 am on Monday 11/19. He got out of bed, took Tums, and walked around and the chest pain resolved to the point where he was back in bed an hour later.  The pain reoccurred yesterday afternoon when he was cooking after mowing his lawn and again relieved in about an hour.  This morning at 1:30 AM he was woken up by the pain is been constant since that time.  The pain is like a pressure over the right side of his chest and radiates down to his right arm to his wrist and up the right side of his neck. It is associated with nausea, burping and anxiety. It was relieved with fentanyl and rest in the ED. It was exacerbated by movement. He denies diaphoresis, the pain is not made worse with deep breathing and is not reproducible on palpation of his chest. He received nitroglycerine while this history was being taken and the pain acutely became much worse.  He notes that he does have joint pain and his right shoulder has been bothering him recently so he had a corticosteroid shot.  His wife notes that he is not a complainer. He has never had chest pain like this in the past.   In the ED he was found to have  an initial blood pressure of 193/120, troponin 0.26 > 0.80 two hours later, EKG was sinus rhythm with mini q waves in leads I and aVL. CT angio chest did not reveal any large proximal PE.   Hospital Course     Consultants: none  The patient was admitted for NSTEMI and hypertensive urgency. He was taken to the cath lab on 12/25/2016 and found to have significant two-vessel coronary artery disease with an occluded ostial OM 2 which is the culprit for myocardial infarction. A DES was successfully placed to the OM2. (see full report above). The patient has residual  disease in the 2nd posterior lateral branch which is to be treated medically. Troponin peaked at 34.41 and is trending down, 6.10 at discharge. The plan is for dual antiplatelet therapy for at least one year and aggressive treatment of risk factors and cardiac rehab. He will be discharged on Brilinta 90 mg bid and aspirin 81 mg daily. He has been counseled on smoking cessation.   Metoprolol was started and then increased to 75 mg bid and irbesartan was increased from 150 mg to 300 mg for better control of hypertension. BP still mildly elevated at discharge. Follow up as outpatient.   For hyperlipidemia, LDL 95, atorvastatin 80 mg daily has been initiated. He will need follow up lipids and LFTs in 6-8 weeks.   There is a question of possible sleep apnea and pt may benefit from an outpatient sleep study.   Patient has been seen by Dr. Tenny Crawoss today and deemed ready for discharge home. All follow up appointments have been scheduled. Discharge medications are listed below. _____________  Discharge Vitals Blood pressure (!) 153/81, pulse 63, temperature 97.9 F (36.6 C), temperature source Oral, resp. rate 16, height 5\' 10"  (1.778 m), weight 291 lb 8 oz (132.2 kg), SpO2 97 %.  Filed Weights   12/25/16 1015 12/26/16 0500 12/27/16 0500  Weight: 300 lb (136.1 kg) 283 lb 4.7 oz (128.5 kg) 291 lb 8 oz (132.2 kg)    Labs & Radiologic Studies    CBC Recent Labs    12/26/16 0010 12/27/16 0325  WBC 11.4* 8.6  HGB 14.3 13.8  HCT 41.7 40.5  MCV 94.1 95.5  PLT 154 122*   Basic Metabolic Panel Recent Labs    16/11/9609/21/18 0446 12/26/16 0010  NA 138 137  K 4.0 4.0  CL 104 105  CO2 26 23  GLUCOSE 132* 101*  BUN 10 8  CREATININE 0.89 0.92  CALCIUM 10.9* 9.1   Liver Function Tests Recent Labs    12/26/16 0010  AST 91*  ALT 38  ALKPHOS 49  BILITOT 1.2  PROT 6.2*  ALBUMIN 3.7   No results for input(s): LIPASE, AMYLASE in the last 72 hours. Cardiac Enzymes Recent Labs    12/25/16 1930  12/26/16 0010 12/27/16 0325  TROPONINI 34.41* 15.73* 6.10*   BNP Invalid input(s): POCBNP D-Dimer No results for input(s): DDIMER in the last 72 hours. Hemoglobin A1C Recent Labs    12/26/16 0010  HGBA1C 5.5   Fasting Lipid Panel Recent Labs    12/26/16 0010  CHOL 154  HDL 32*  LDLCALC 95  TRIG [id number]  CHOLHDL 4.8   Thyroid Function Tests No results for input(s): TSH, T4TOTAL, T3FREE, THYROIDAB in the last 72 hours.  Invalid input(s): FREET3 _____________  Dg Chest 2 View  Result Date: 12/25/2016 CLINICAL DATA:  Right-sided chest pain for couple of days. EXAM: CHEST  2 VIEW COMPARISON:  None. FINDINGS: Normal heart  size and pulmonary vascularity. No focal airspace disease or consolidation in the lungs. No blunting of costophrenic angles. No pneumothorax. Mediastinal contours appear intact. Degenerative changes in the spine. IMPRESSION: No active cardiopulmonary disease. Electronically Signed   By: Burman Nieves M.D.   On: 12/25/2016 05:04   Ct Angio Chest Pe W And/or Wo Contrast  Result Date: 12/25/2016 CLINICAL DATA:  51 year old male with a history of chest pain for about 1 week. EXAM: CT ANGIOGRAPHY CHEST WITH CONTRAST TECHNIQUE: Multidetector CT imaging of the chest was performed using the standard protocol during bolus administration of intravenous contrast. Multiplanar CT image reconstructions and MIPs were obtained to evaluate the vascular anatomy. CONTRAST:  ISOVUE-370 IOPAMIDOL (ISOVUE-370) INJECTION 76% COMPARISON:  Chest x-ray 12/25/2016 FINDINGS: Cardiovascular: Heart: No cardiomegaly. No pericardial fluid/thickening. No significant coronary calcifications. Minimal aortic valve calcifications. Aorta: Unremarkable course, caliber, contour of the thoracic aorta. No aneurysm or dissection flap. No periaortic fluid. Pulmonary arteries: No central, lobar, segmental, or proximal subsegmental filling defects. Mediastinum/Nodes: Mediastinal lymph nodes are present,  none of which are enlarged by CT size criteria. Unremarkable appearance of the thoracic esophagus. Unremarkable appearance of the thoracic inlet and thyroid. Lungs/Pleura: No confluent airspace disease, pneumothorax, or pleural effusion. No endotracheal or endobronchial debris. No bronchial wall thickening. Atelectasis, accounting for mild geographic ground-glass appearance of the lungs. Upper Abdomen: Unremarkable. Musculoskeletal: No displaced fracture. No significant degenerative changes of the spine. Review of the MIP images confirms the above findings. IMPRESSION: Study is negative for pulmonary emboli. No acute CT finding to account for chest pain. Electronically Signed   By: Gilmer Mor D.O.   On: 12/25/2016 08:25   Disposition   Pt is being discharged home today in good condition.  Follow-up Plans & Appointments    Follow-up Information    Pricilla Riffle, MD Follow up.   Specialty:  Cardiology Why:  On December 6th at 3:20 for cardiolgy hospital follow up.  Contact information: [address] [place] [address] [phone]          Discharge Instructions    Amb Referral to Cardiac Rehabilitation   Complete by:  As directed    Diagnosis:   Coronary Stents STEMI     Diet - low sodium heart healthy   Complete by:  As directed    Discharge instructions   Complete by:  As directed    PLEASE REMEMBER TO BRING ALL OF YOUR MEDICATIONS TO EACH OF YOUR FOLLOW-UP OFFICE VISITS.  PLEASE ATTEND ALL SCHEDULED FOLLOW-UP APPOINTMENTS.   Activity: Increase activity slowly as tolerated. You may shower, but no soaking baths (or swimming) for 1 week. No driving for 1 week. No lifting over 10 lbs for 2 weeks. No sexual activity for 2 weeks.   You May Return to Work: in 1 week (if applicable)  Wound Care: You may wash cath site gently with soap and water. Keep cath site clean and dry. If you notice pain, swelling, bleeding or pus at your cath site, please call  [phone].   Increase activity slowly   Complete by:  As directed       Discharge Medications   Current Discharge Medication List    START taking these medications   Details  aspirin EC 81 MG EC tablet Take 1 tablet (81 mg total) by mouth daily. Qty: 90 tablet, Refills: 3    atorvastatin (LIPITOR) 80 MG tablet Take 1 tablet (80 mg total) by mouth daily at 6 PM. Qty: 90  tablet, Refills: 3    Metoprolol Tartrate 75 MG TABS Take 75 mg by mouth 2 (two) times daily. Qty: 90 tablet, Refills: 3    nitroGLYCERIN (NITROSTAT) 0.4 MG SL tablet Place 1 tablet (0.4 mg total) under the tongue every 5 (five) minutes as needed for chest pain. Qty: 25 tablet, Refills: 12    ticagrelor (BRILINTA) 90 MG TABS tablet Take 1 tablet (90 mg total) by mouth 2 (two) times daily. Qty: 60 tablet, Refills: 11      CONTINUE these medications which have CHANGED   Details  irbesartan (AVAPRO) 300 MG tablet Take 1 tablet (300 mg total) by mouth daily. Qty: 90 tablet, Refills: 3      CONTINUE these medications which have NOT CHANGED   Details  fluticasone (FLONASE) 50 MCG/ACT nasal spray Place 2 sprays into both nostrils daily as needed for allergies or rhinitis.    ibuprofen (ADVIL,MOTRIN) 200 MG tablet Take 600-800 mg by mouth every 6 (six) hours as needed for headache or mild pain.         Aspirin prescribed at discharge?  Yes High Intensity Statin Prescribed? (Lipitor 40-80mg  or Crestor 20-40mg ): Yes Beta Blocker Prescribed? Yes For EF <40%, was ACEI/ARB Prescribed? Yes ADP Receptor Inhibitor Prescribed? (i.e. Plavix etc.-Includes Medically Managed Patients): Yes For EF <40%, Aldosterone Inhibitor Prescribed? No: normal EF Was EF assessed during THIS hospitalization? Yes Was Cardiac Rehab II ordered? (Included Medically managed Patients): Yes   Outstanding Labs/Studies   Follow up lipids and LFTS in 6-8 weeks  Duration of Discharge Encounter   Greater than 30 minutes including  physician time.  Signed, Berton BonJanine Shyteria Lewis NP 12/27/2016, 1:20 PM

## 2016-12-29 ENCOUNTER — Telehealth: Payer: Self-pay | Admitting: Physician Assistant

## 2016-12-29 NOTE — Telephone Encounter (Signed)
Patient paged after hour answering service, he had constipation yesterday and had to strain to make a bowel movement, he saw some spot of bright red blood in the stool this morning, but nothing significant. Spots is resolving. I asked him to continue monitor and let us know if bloody stool increase. Now he says he is having diarrhea this morning. I don't think this is related to his cardiac medication. Again, continue observation.  Ramond DialSigned, Marlyn Tondreau PA Pager: [phone]

## 2016-12-30 ENCOUNTER — Telehealth (HOSPITAL_COMMUNITY): Payer: Self-pay

## 2016-12-30 ENCOUNTER — Other Ambulatory Visit: Payer: Self-pay | Admitting: *Deleted

## 2016-12-30 MED ORDER — METOPROLOL TARTRATE 25 MG PO TABS: 75.0000 mg | ORAL_TABLET | Freq: Two times a day (BID) | ORAL | 0 refills | Status: DC

## 2016-12-30 NOTE — Telephone Encounter (Signed)
Patients insurance is active and benefits verified through [place] - $25 co-pay, deductible amount of $4,000/$232.45 has been met, out of pocket amount of $6,500/$392.45 has been met, no co-insurance, and no pre-authorization is required. [employer] faxed over information.  Patient will be contacted and scheduled after their follow up visit with the Cardiologist office upon review by the RN Navigator.

## 2016-12-30 NOTE — Telephone Encounter (Signed)
Per call from patient, this mg is unavailable at all phamacies that he contacted. He is aware that I will change the rx to 25 mg and he will need to take three tabs bid. He only wants a thirty day rx as he may get this from mail order for future refills if he remains on this dose. Patient verbalized his understanding and appreciation.

## 2016-12-31 ENCOUNTER — Telehealth: Payer: Self-pay | Admitting: Internal Medicine

## 2016-12-31 NOTE — Telephone Encounter (Signed)
Walk in pt form-Sealed envelope Dropped off placed in Dr.Ross doc box

## 2017-01-01 ENCOUNTER — Telehealth: Payer: Self-pay | Admitting: Internal Medicine

## 2017-01-01 DIAGNOSIS — R195 Other fecal abnormalities: Secondary | ICD-10-CM

## 2017-01-01 DIAGNOSIS — R040 Epistaxis: Secondary | ICD-10-CM

## 2017-01-01 NOTE — Telephone Encounter (Signed)
Patient called in asking if his paperwork was completed he dropped off yesterday. Patient dropped off Sealed envelope I placed in Dr.Ross doc box on 12/31/16 after speaking to patient he dropped off disability paperwork that needs to be completed. I explained to patient he will need to come in sign a release form,make a $25.00 payment and our copy service will complete paperwork and it takes 10/14 days for completion.   Patient got upset on phone stated he should of been told this yesterday, I explained to patient the lady who sits at front desk is a new member to or team and is learning. Patient didn't want to hear that. Patient did however state he will come in sign paperwork an make payment.

## 2017-01-01 NOTE — Telephone Encounter (Signed)
New message   Pt blows his nose it starts bleeding and he still has a headache he is currently at home with pt

## 2017-01-01 NOTE — Telephone Encounter (Signed)
Patient complaining of having bleeding when he blows his nose and dark, hard, black stools. Patient stated his nose does not keep bleeding, but only bleeds when he blows it. Patient stated he had some constipation, then diarrhea, and then his stool was dark and black. Informed patient that if his nose does not stop bleeding, he might need to see an ENT doctor. Informed patient that his nose is probably just dry. Informed patient that if he should follow up with his PCP about his stools. Informed patient that bright red blood in stool indicates bleeding in his lower GI and dark red stools could be due to upper GI bleed. Patient stated he has not seen dark or bright red in his stool. Informed patient that a message would be sent to Dr. Tenny Crawoss for further advisement.

## 2017-01-02 NOTE — Telephone Encounter (Signed)
Called patient.  He has not contacted PCP yet.  Advised that was Dr. Charlott Rakesoss's recommendation as far as GI issues.  Scheduled him for CBC tomorrow at our clinic.  He is aware of lab hours.

## 2017-01-02 NOTE — Telephone Encounter (Signed)
Needs CBC in next few days   Nees to call PCP re GI issues.

## 2017-01-03 ENCOUNTER — Other Ambulatory Visit: Payer: 59 | Admitting: *Deleted

## 2017-01-03 DIAGNOSIS — R195 Other fecal abnormalities: Secondary | ICD-10-CM

## 2017-01-03 DIAGNOSIS — R040 Epistaxis: Secondary | ICD-10-CM

## 2017-01-03 LAB — CBC
Hematocrit: 44.4 % (ref 37.5–51.0)
Hemoglobin: 15.9 g/dL (ref 13.0–17.7)
MCH: 32.9 pg (ref 26.6–33.0)
MCHC: 35.8 g/dL — ABNORMAL HIGH (ref 31.5–35.7)
MCV: 92 fL (ref 79–97)
Platelets: 223 10*3/uL (ref 150–379)
RBC: 4.83 x10E6/uL (ref 4.14–5.80)
RDW: 12.8 % (ref 12.3–15.4)
WBC: 8.7 10*3/uL (ref 3.4–10.8)

## 2017-01-09 ENCOUNTER — Ambulatory Visit (INDEPENDENT_AMBULATORY_CARE_PROVIDER_SITE_OTHER): Payer: 59 | Admitting: Internal Medicine

## 2017-01-09 ENCOUNTER — Encounter: Payer: Self-pay | Admitting: Internal Medicine

## 2017-01-09 VITALS — BP 112/68 | HR 63 | Ht 70.0 in | Wt 290.4 lb

## 2017-01-09 DIAGNOSIS — I214 Non-ST elevation (NSTEMI) myocardial infarction: Secondary | ICD-10-CM | POA: Diagnosis not present

## 2017-01-09 DIAGNOSIS — R002 Palpitations: Secondary | ICD-10-CM

## 2017-01-09 NOTE — Progress Notes (Signed)
Cardiology Office Note   Date:  01/09/2017   ID:  Fred Maxwell, DOB 09/18/1965, MRN [id number]  PCP:  Clayborn Heronankins, Victoria R, MD  Cardiologist:   Dietrich PatesPaula Chera Slivka, MD       History of Present Illness: Fred PihDarren Maxwell is a 51 y.o. male with a history of GERD, gout, obesity, FHX of CAD , tob   Admitted with CP on 11/21  Suffered NSTEMI    Cardiac cath done   This showed    2nd RPLB lesion is 85% stenosed.  Ost 2nd Mrg to 2nd Mrg lesion is 100% stenosed.  Prox LAD lesion is 40% stenosed.  The left ventricular systolic function is normal.  LV end diastolic pressure is mildly elevated.  The left ventricular ejection fraction is 55-65% by visual estimate.  A drug-eluting stent was successfully placed using a STENT RESOLUTE ONYX 2.0X30.  Post intervention, there is a 0% residual stenosis.  2nd Mrg lesion is 40% stenosed.   1.  Significant two-vessel coronary artery disease with an occluded ostial OM 2 which is the culprit for myocardial infarction.  There is significant stenosis in ostial RPL2 in a very tortuous segment.  Moderate proximal LAD disease. 2.  Normal left ventricular systolic function and mildly elevated left ventricular end-diastolic pressure. 3.  Successful angioplasty and drug-eluting stent placement to OM 2.  The vessel was overall small and diffusely diseased and required a 2 mm drug-eluting stent.  Recommendations: Dual antiplatelet therapy for at least one year.  Aggressive treatment of risk factors and cardiac rehab. RPL2 is not suitable for PCI given tortuous segment.  The rest of his coronary artery disease should be treated medically.  Since seen he denies CP   He has been anxious  Feels  tired      Current Meds  Medication Sig  . aspirin EC 81 MG EC tablet Take 1 tablet (81 mg total) by mouth daily.  Marland Kitchen. atorvastatin (LIPITOR) 80 MG tablet Take 1 tablet (80 mg total) by mouth daily at 6 PM.  . Docusate Calcium (STOOL SOFTENER PO) Take 1 capsule by mouth  every other day.  . fluticasone (FLONASE) 50 MCG/ACT nasal spray Place 2 sprays into both nostrils daily as needed for allergies or rhinitis.  Marland Kitchen. ibuprofen (ADVIL,MOTRIN) 200 MG tablet Take 600-800 mg by mouth every 6 (six) hours as needed for headache or mild pain.  Marland Kitchen. irbesartan (AVAPRO) 300 MG tablet Take 1 tablet (300 mg total) by mouth daily.  . metoprolol tartrate (LOPRESSOR) 25 MG tablet Take 3 tablets (75 mg total) by mouth 2 (two) times daily.  . nitroGLYCERIN (NITROSTAT) 0.4 MG SL tablet Place 1 tablet (0.4 mg total) under the tongue every 5 (five) minutes as needed for chest pain.  Bernadette Hoit. Sennosides-Docusate Sodium (PERI-COLACE PO) Take by mouth as needed.  . ticagrelor (BRILINTA) 90 MG TABS tablet Take 1 tablet (90 mg total) by mouth 2 (two) times daily.     Allergies:   Morphine and related   Past Medical History:  Diagnosis Date  . Gout   . Hx of gastroesophageal reflux (GERD)   . Hypertension   . Joint pain   . Obesity     Past Surgical History:  Procedure Laterality Date  . CORONARY STENT INTERVENTION N/A 12/25/2016   Procedure: CORONARY STENT INTERVENTION;  Surgeon: Iran OuchArida, Muhammad A, MD;  Location: [facility] INVASIVE CV LAB;  Service: Cardiovascular;  Laterality: N/A;  . LEFT HEART CATH AND CORONARY ANGIOGRAPHY N/A 12/25/2016   Procedure: LEFT HEART  CATH AND CORONARY ANGIOGRAPHY;  Surgeon: Iran OuchArida, Muhammad A, MD;  Location: [facility] INVASIVE CV LAB;  Service: Cardiovascular;  Laterality: N/A;     Social History:  The patient  has an unknown smoking status. he has never used smokeless tobacco.   Family History:  The patient's family history includes Heart attack in his maternal grandmother; Other in his mother.    ROS:  Please see the history of present illness. All other systems are reviewed and  Negative to the above problem except as noted.    PHYSICAL EXAM: VS:  BP 112/68   Pulse 63   Ht 5\' 10"  (1.778 m)   Wt 290 lb 6.4 oz (131.7 kg)   SpO2 96%   BMI 41.67 kg/m   GEN:  Well nourished, well developed, in no acute distress  HEENT: normal  Neck: no JVD, carotid bruits, or masses Cardiac: RRR; no murmurs, rubs, or gallops,no edema  Respiratory:  clear to auscultation bilaterally, normal work of breathing GI: soft, nontender, nondistended, + BS  No hepatomegaly  MS: no deformity Moving all extremities   Skin: warm and dry, no rash Neuro:  Strength and sensation are intact Psych: euthymic mood, full affect   EKG:  EKG is not  ordered today.   Lipid Panel    Component Value Date/Time   CHOL 154 12/26/2016 0010   TRIG 134 12/26/2016 0010   HDL 32 (L) 12/26/2016 0010   CHOLHDL 4.8 12/26/2016 0010   VLDL 27 12/26/2016 0010   LDLCALC 95 12/26/2016 0010      Wt Readings from Last 3 Encounters:  01/09/17 290 lb 6.4 oz (131.7 kg)  12/27/16 291 lb 8 oz (132.2 kg)  07/04/11 (!) 306 lb (138.8 kg)      ASSESSMENT AND PLAN:  1  CAD   REcnt admit with cath/intervention as noted above   Plan for medical Rx for residual dz The pt works moving truck tires.  From a cardiac standpoint I have recomm that he enroll in cardiac rehab first before going back to work.    2  HL  Keep on lipitor   WIll follow lipids    3  HTN  BP is good   4  Morbid obesity  Discussed wt loss with diet and exercise     Current medicines are reviewed at length with the patient today.  The patient does not have concerns regarding medicines.  Signed, Dietrich PatesPaula Desa Rech, MD  01/09/2017 3:50 PM    [facility] [address], [place], [place]  [address] Phone: [phone]; Fax: [phone]

## 2017-01-09 NOTE — Patient Instructions (Signed)
Your physician recommends that you continue on your current medications as directed. Please refer to the Current Medication list given to you today.     

## 2017-01-10 ENCOUNTER — Telehealth (HOSPITAL_COMMUNITY): Payer: Self-pay

## 2017-01-10 NOTE — Telephone Encounter (Signed)
Attempted to call patient in regards to Cardiac Rehab - Lm on vm °

## 2017-01-14 ENCOUNTER — Telehealth (HOSPITAL_COMMUNITY): Payer: Self-pay

## 2017-01-14 NOTE — Telephone Encounter (Signed)
Patient returned phone call in regards to Cardiac Rehab. Patient interested in program - scheduled orientation on 02/18/2017 at 1:30pm. Patient will attend the 2:45pm exc class.

## 2017-01-15 ENCOUNTER — Telehealth: Payer: Self-pay | Admitting: Internal Medicine

## 2017-01-15 NOTE — Telephone Encounter (Signed)
Patient would like to know if he could get to cardiac rehab at a different location sooner than [facility].  Advised I would try to find out for him and give him a call back.  Pt states [facility] location for him.

## 2017-01-15 NOTE — Telephone Encounter (Signed)
Patient calling, states that he is scheduled for Cardiac Rehab orientation on 02-19-16. Patient would like to know if he can return to work before then or if he could be seen somewhere else before January.

## 2017-01-16 NOTE — Telephone Encounter (Signed)
Spoke with [facility] cardiac rehab. They also are scheduling orientation in January.  Placed order for [facility] at [facility] since this is patient's preferred location.    Left message at patient's home number of this update and that they will give him a call to schedule.

## 2017-01-20 ENCOUNTER — Telehealth: Payer: Self-pay | Admitting: Internal Medicine

## 2017-01-20 NOTE — Telephone Encounter (Signed)
Pt now set up to start cardiac rehab 02/18/17.  Pt states that he is ok with being out of work for a few more weeks but can't afford to be out of work any longer then that.  Pt works with large truck tires.  States he doesn't lift them but does do a lot of pushing.  Advised pt I would send message to Dr. Tenny Crawoss for further review.  Pt appreciative for call.

## 2017-01-20 NOTE — Telephone Encounter (Signed)
°  New Prob  Pt calling regarding clearance to return back to work. Requesting to speak to nurse. Please call.

## 2017-01-20 NOTE — Telephone Encounter (Signed)
Can he start cardiac rehab before 1/15?

## 2017-01-21 NOTE — Telephone Encounter (Signed)
Pt states he has been told he cannot start rehab until 02/18/17--he states Dr Tenny Crawoss wants him to participate in Cardiac Rehab 1 week before returning to work. I called and spoke with Dahlia ClientHannah at Cardiac Rehab--they do not have sooner appointment time than 02/18/17 for orientation at Cardiac Rehab (pt is already scheduled this day), pt would not begin exercise until 02/25/17.   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  Pt advised I will forward to Dr Tenny Crawoss for review.

## 2017-01-23 NOTE — Telephone Encounter (Signed)
Follow Up:   Pt is calling to find out what Dr Tenny Crawoss said.

## 2017-01-24 NOTE — Telephone Encounter (Signed)
Left message for patient to call back  

## 2017-01-24 NOTE — Telephone Encounter (Signed)
Left message to call back at home home. Called mobile number, got recording that said "no answer at requested number."

## 2017-01-24 NOTE — Telephone Encounter (Signed)
Follow up ° ° ° °Patient returning call.  Please call °

## 2017-01-29 ENCOUNTER — Other Ambulatory Visit: Payer: Self-pay | Admitting: Internal Medicine

## 2017-01-30 NOTE — Telephone Encounter (Signed)
F/u message  Pt returning RN call to see when he will be able to return back to work.

## 2017-01-31 NOTE — Telephone Encounter (Signed)
Informed patient that we have not gotten a response on this from Dr. Tenny Crawoss yet.  Explained we would call once advised on.  Pt appreciative of the update

## 2017-01-31 NOTE — Telephone Encounter (Signed)
Advised pt of Dr. Charlott Rakesoss's recommendation. Pt states he cannot afford to miss anymore work and would like reconsideration.  States that cardiac rehab doesn't start for another 2 weeks.  Will forward to Dr. Tenny Crawoss to see if she has anything different to add/recommend. (pt scheduled to see her on 1/4 but doesn't want to wait until then for an answer.  I did inform him that she is on vacation until 1/4 but we would call if she responds before then)

## 2017-01-31 NOTE — Telephone Encounter (Signed)
Pt should start in cardiac rehab first before going back to work  His job is highly physical Please make sure consult in and being processed for this

## 2017-02-07 ENCOUNTER — Encounter: Payer: Self-pay | Admitting: Internal Medicine

## 2017-02-07 ENCOUNTER — Ambulatory Visit (INDEPENDENT_AMBULATORY_CARE_PROVIDER_SITE_OTHER): Payer: 59 | Admitting: Internal Medicine

## 2017-02-07 VITALS — BP 114/68 | HR 91 | Ht 70.0 in | Wt 294.2 lb

## 2017-02-07 DIAGNOSIS — I2510 Atherosclerotic heart disease of native coronary artery without angina pectoris: Secondary | ICD-10-CM | POA: Diagnosis not present

## 2017-02-07 DIAGNOSIS — I214 Non-ST elevation (NSTEMI) myocardial infarction: Secondary | ICD-10-CM | POA: Diagnosis not present

## 2017-02-07 DIAGNOSIS — E782 Mixed hyperlipidemia: Secondary | ICD-10-CM | POA: Diagnosis not present

## 2017-02-07 NOTE — Patient Instructions (Addendum)
Your physician recommends that you continue on your current medications as directed. Please refer to the Current Medication list given to you today.  Your physician recommends that you return for lab work today (LIPIDS, CBC)  Your physician recommends that you schedule a follow-up appointment around the end of March with Dr. Tenny Crawoss.

## 2017-02-07 NOTE — Progress Notes (Signed)
Cardiology Office Note   Date:  02/07/2017   ID:  [name], DOB 10/12/1965, MRN [id number]  PCP:  Clayborn Heron, MD  Cardiologist:   Dietrich Pates, MD    F/U of CAD    History of Present Illness: Fred Maxwell is a 52 y.o. male with a history of GERD, gout, obesity, FHX of CAD , tob   Admitted with CP on 11/21  Suffered NSTEMI    Cardiac cath done   This showed    2nd RPLB lesion is 85% stenosed.  Ost 2nd Mrg to 2nd Mrg lesion is 100% stenosed.  Prox LAD lesion is 40% stenosed.  The left ventricular systolic function is normal.  LV end diastolic pressure is mildly elevated.  The left ventricular ejection fraction is 55-65% by visual estimate.  A drug-eluting stent was successfully placed using a STENT RESOLUTE ONYX 2.0X30.  Post intervention, there is a 0% residual stenosis.  2nd Mrg lesion is 40% stenosed.   1.  Significant two-vessel coronary artery disease with an occluded ostial OM 2 which is the culprit for myocardial infarction.  There is significant stenosis in ostial RPL2 in a very tortuous segment.  Moderate proximal LAD disease. 2.  Normal left ventricular systolic function and mildly elevated left ventricular end-diastolic pressure. 3.  Successful angioplasty and drug-eluting stent placement to OM 2.  The vessel was overall small and diffusely diseased and required a 2 mm drug-eluting stent.  Recommendations: Dual antiplatelet therapy for at least one year.  Aggressive treatment of risk factors and cardiac rehab. RPL2 is not suitable for PCI given tortuous segment.  The rest of his coronary artery disease should be treated medically.  Since seen he denies CP Walking 3 to 4 miles per day No CP  No SOB  Feeling better    I saw the pt in Dec No outpatient medications have been marked as taking for the 02/07/17 encounter (Office Visit) with Pricilla Riffle, MD.     Allergies:   Morphine and related   Past Medical History:  Diagnosis Date  .  Gout   . Hx of gastroesophageal reflux (GERD)   . Hypertension   . Joint pain   . Obesity     Past Surgical History:  Procedure Laterality Date  . CORONARY STENT INTERVENTION N/A 12/25/2016   Procedure: CORONARY STENT INTERVENTION;  Surgeon: Iran Ouch, MD;  Location: [facility];  Service: Cardiovascular;  Laterality: N/A;  . LEFT HEART CATH AND CORONARY ANGIOGRAPHY N/A 12/25/2016   Procedure: LEFT HEART CATH AND CORONARY ANGIOGRAPHY;  Surgeon: Iran Ouch, MD;  Location: [facility];  Service: Cardiovascular;  Laterality: N/A;     Social History:  The patient  has an unknown smoking status. he has never used smokeless tobacco.   Family History:  The patient's family history includes Heart attack in his maternal grandmother; Other in his mother.    ROS:  Please see the history of present illness. All other systems are reviewed and  Negative to the above problem except as noted.    PHYSICAL EXAM: VS:  BP 114/68   Pulse 91   Ht 5\' 10"  (1.778 m)   Wt 294 lb 3.2 oz (133.4 kg)   SpO2 98%   BMI 42.21 kg/m   GEN: Morbidly obese 52 yo in no acute distress  HEENT: normal  Neck: no JVD, carotid bruits, or masses Cardiac: RRR; no murmurs, rubs, or gallops,no edema  Respiratory:  clear  to auscultation bilaterally, normal work of breathing GI: soft, nontender, nondistended, + BS  No hepatomegaly  MS: no deformity Moving all extremities   Skin: warm and dry, no rash Neuro:  Strength and sensation are intact Psych: euthymic mood, full affect   EKG:  EKG is not  ordered today.   Lipid Panel    Component Value Date/Time   CHOL 154 12/26/2016 0010   TRIG 134 12/26/2016 0010   HDL 32 (L) 12/26/2016 0010   CHOLHDL 4.8 12/26/2016 0010   VLDL 27 12/26/2016 0010   LDLCALC 95 12/26/2016 0010      Wt Readings from Last 3 Encounters:  02/07/17 294 lb 3.2 oz (133.4 kg)  01/09/17 290 lb 6.4 oz (131.7 kg)  12/27/16 291 lb 8 oz (132.2 kg)       ASSESSMENT AND PLAN:  1  CAD   Pt with recent NSTEMI  S/p PTCA / DES to distal LCX  Still with tight narrowing of distal PLSA  Doing well  Has not heard back from cardiac rehab  Has been walking 4 miles per day  Doing good from cardiac standpoint  No CP  Breathing good  He needs to go back to work he says   He says work requirements have changed  Not as physical   I think it is OK for him to resume  Ramp aup activity as tolerated  Stay on current meds   2  HL  Keep on lipitor   WIll get lipids today    3  HTN  BP is good   4  Morbid obesity  Discussed wt loss with diet and exercise  Ramp up activity   Check CBC and lipids   F?U at end of march       Current medicines are reviewed at length with the patient today.  The patient does not have concerns regarding medicines.  Signed, Dietrich PatesPaula Verlin Uher, MD  02/07/2017 12:00 PM    [facility] [address], [place], [place]  [address] Phone: [phone]; Fax: [phone]

## 2017-02-08 LAB — LIPID PANEL
Chol/HDL Ratio: 3.3 ratio (ref 0.0–5.0)
Cholesterol, Total: 120 mg/dL (ref 100–199)
HDL: 36 mg/dL — ABNORMAL LOW (ref >39)
LDL Calculated: 55 mg/dL (ref 0–99)
Triglycerides: 143 mg/dL (ref 0–149)
VLDL Cholesterol Cal: 29 mg/dL (ref 5–40)

## 2017-02-08 LAB — CBC
Hematocrit: 42.2 % (ref 37.5–51.0)
Hemoglobin: 14.8 g/dL (ref 13.0–17.7)
MCH: 33 pg (ref 26.6–33.0)
MCHC: 35.1 g/dL (ref 31.5–35.7)
MCV: 94 fL (ref 79–97)
Platelets: 216 10*3/uL (ref 150–379)
RBC: 4.49 x10E6/uL (ref 4.14–5.80)
RDW: 13.3 % (ref 12.3–15.4)
WBC: 8 10*3/uL (ref 3.4–10.8)

## 2017-02-12 ENCOUNTER — Telehealth: Payer: Self-pay | Admitting: Internal Medicine

## 2017-02-12 NOTE — Telephone Encounter (Signed)
Will route to medical records.  I have not received a form today.

## 2017-02-12 NOTE — Telephone Encounter (Signed)
New message     Patient states he dropped off paperwork at the front desk today. Wants a phone call from nurse when form completed.

## 2017-02-13 ENCOUNTER — Telehealth (HOSPITAL_COMMUNITY): Payer: Self-pay | Admitting: Pharmacy Technician

## 2017-02-13 NOTE — Telephone Encounter (Signed)
Form received from medical records. Consent to Perform Employment Test signed by Dr. Tenny Crawoss.  Called patient to inform it is complete.  Will pick up at front desk.  Requested I fax signed form to [employer] attn: Ryerson Inclan. [phone]

## 2017-02-13 NOTE — Telephone Encounter (Signed)
Cardiac Rehab Medication Review by a Pharmacist  Does the patient  feel that his/her medications are working for him/her?  yes  Has the patient been experiencing any side effects to the medications prescribed?  yes  Does the patient measure his/her own blood pressure or blood glucose at home?  no   Does the patient have any problems obtaining medications due to transportation or finances?   no  Understanding of regimen: fair Understanding of indications: fair Potential of compliance: good    Pharmacist comments: Pt has no issues getting medications and complains only of mild constipation that he attributes to his new medications, and is on stool softeners.      Fred PoseyJonathan Keldrick Maxwell, PharmD Pharmacy Resident Pager #: [phone] 02/13/2017 4:14 PM

## 2017-02-18 ENCOUNTER — Encounter (HOSPITAL_COMMUNITY): Payer: Self-pay

## 2017-02-18 ENCOUNTER — Telehealth: Payer: Self-pay | Admitting: Internal Medicine

## 2017-02-18 ENCOUNTER — Encounter (HOSPITAL_COMMUNITY)
Admission: RE | Admit: 2017-02-18 | Discharge: 2017-02-18 | Disposition: A | Discharge status: Home / Self Care | Payer: 59 | Source: Ambulatory Visit | Attending: Internal Medicine | Admitting: Internal Medicine

## 2017-02-18 VITALS — BP 136/70 | HR 67 | Ht 68.75 in | Wt 299.8 lb

## 2017-02-18 DIAGNOSIS — I214 Non-ST elevation (NSTEMI) myocardial infarction: Secondary | ICD-10-CM

## 2017-02-18 DIAGNOSIS — Z7982 Long term (current) use of aspirin: Secondary | ICD-10-CM | POA: Insufficient documentation

## 2017-02-18 DIAGNOSIS — Z79899 Other long term (current) drug therapy: Secondary | ICD-10-CM | POA: Insufficient documentation

## 2017-02-18 DIAGNOSIS — M109 Gout, unspecified: Secondary | ICD-10-CM | POA: Insufficient documentation

## 2017-02-18 DIAGNOSIS — Z955 Presence of coronary angioplasty implant and graft: Secondary | ICD-10-CM | POA: Insufficient documentation

## 2017-02-18 DIAGNOSIS — I2510 Atherosclerotic heart disease of native coronary artery without angina pectoris: Secondary | ICD-10-CM | POA: Insufficient documentation

## 2017-02-18 DIAGNOSIS — I10 Essential (primary) hypertension: Secondary | ICD-10-CM | POA: Insufficient documentation

## 2017-02-18 DIAGNOSIS — K219 Gastro-esophageal reflux disease without esophagitis: Secondary | ICD-10-CM | POA: Insufficient documentation

## 2017-02-18 DIAGNOSIS — E785 Hyperlipidemia, unspecified: Secondary | ICD-10-CM | POA: Insufficient documentation

## 2017-02-18 HISTORY — DX: Hyperlipidemia, unspecified: E78.5

## 2017-02-18 NOTE — Progress Notes (Signed)
Patient reports that he experiences shortness of breath sometimes 2-3 times a day with or without exerting himself. Fred Maxwell returned to work 3 days ago. Fred Maxwell denied having any chest pain or shortness of breath during his walk test today. Fred Maxwell says he notices the shortness of breath when he is talking, spontaneously. Oxygen saturation 98% on room air. Sherri Price RN called and notified at Triage at Dr Charlott Rakesoss's office. Fred Maxwell says he has been noticing this for the past month. No complaints upon exit from cardiac rehab.Fred HeadingsMaria Walden Layann Bluett RN BSN

## 2017-02-18 NOTE — Progress Notes (Signed)
Cardiac Individual Treatment Plan  Patient Details  Name: Fred Maxwell MRN: [id number] Date of Birth: 17-Jan-1966 Referring Provider:     CARDIAC REHAB PHASE II ORIENTATION from 02/18/2017 in [facility] CARD[facility]AC REHAB  Referring Provider  Dietrich Pates MD      Initial Encounter Date:    CARDIAC REHAB PHASE II ORIENTATION from 02/18/2017 in [facility] CARDIAC REHAB  Date  02/18/17  Referring Provider  Dietrich Pates MD      Visit Diagnosis: NSTEMI (non-ST elevated myocardial infarction) (HCC)12/25/16  Status post coronary artery stent placement 12/25/16 S/P DES OM2  Patient's Home Medications on Admission:  Current Outpatient Medications:  .  aspirin EC 81 MG EC tablet, Take 1 tablet (81 mg total) by mouth daily., Disp: 90 tablet, Rfl: 3 .  atorvastatin (LIPITOR) 80 MG tablet, Take 1 tablet (80 mg total) by mouth daily at 6 PM., Disp: 90 tablet, Rfl: 3 .  Docusate Calcium (STOOL SOFTENER PO), Take 1 capsule by mouth every other day., Disp: , Rfl:  .  fluticasone (FLONASE) 50 MCG/ACT nasal spray, Place 2 sprays into both nostrils daily as needed for allergies or rhinitis., Disp: , Rfl:  .  ibuprofen (ADVIL,MOTRIN) 200 MG tablet, Take 600-800 mg by mouth every 6 (six) hours as needed for headache or mild pain., Disp: , Rfl:  .  irbesartan (AVAPRO) 300 MG tablet, Take 1 tablet (300 mg total) by mouth daily., Disp: 90 tablet, Rfl: 3 .  metoprolol tartrate (LOPRESSOR) 25 MG tablet, TAKE 3 TABLETS BY MOUTH TWICE DAILY, Disp: 180 tablet, Rfl: 3 .  nitroGLYCERIN (NITROSTAT) 0.4 MG SL tablet, Place 1 tablet (0.4 mg total) under the tongue every 5 (five) minutes as needed for chest pain., Disp: 25 tablet, Rfl: 12 .  Sennosides-Docusate Sodium (PERI-COLACE PO), Take by mouth as needed., Disp: , Rfl:  .  ticagrelor (BRILINTA) 90 MG TABS tablet, Take 1 tablet (90 mg total) by mouth 2 (two) times daily., Disp: 60 tablet, Rfl: 11  Past Medical History: Past Medical  History:  Diagnosis Date  . Coronary artery disease   . Gout   . Hx of gastroesophageal reflux (GERD)   . Hyperlipidemia   . Hypertension   . Joint pain   . Obesity     Tobacco Use: Social History   Tobacco Use  Smoking Status Unknown If Ever Smoked  Smokeless Tobacco Never Used    Labs: Recent Review Advice worker    Labs for ITP Cardiac and Pulmonary Rehab Latest Ref Rng & Units 12/25/2016 12/26/2016 02/07/2017   Cholestrol 100 - 199 mg/dL [phone]   LDLCALC 0 - 99 mg/dL 782(N) 95 55   HDL >56 mg/dL 21(H) 08(M) 57(Q)   Trlycerides 0 - 149 mg/dL [phone]   Hemoglobin A1c 4.8 - 5.6 % 5.6 5.5 -      Capillary Blood Glucose: No results found for: GLUCAP   Exercise Target Goals: Date: 02/18/17  Exercise Program Goal: Individual exercise prescription set with THRR, safety & activity barriers. Participant demonstrates ability to understand and report RPE using BORG scale, to self-measure pulse accurately, and to acknowledge the importance of the exercise prescription.  Exercise Prescription Goal: Starting with aerobic activity 30 plus minutes a day, 3 days per week for initial exercise prescription. Provide home exercise prescription and guidelines that participant acknowledges understanding prior to discharge.  Activity Barriers & Risk Stratification: Activity Barriers & Cardiac Risk Stratification - 02/18/17 1525  Activity Barriers & Cardiac Risk Stratification   Activity Barriers  Deconditioning;Muscular Weakness;Other (comment)    Comments  L ankle gout    Cardiac Risk Stratification  High       6 Minute Walk: 6 Minute Walk    Row Name 02/18/17 1514 02/18/17 1521       6 Minute Walk   Phase  Initial  -    Distance  -  1572 feet    Walk Time  -  6 minutes    # of Rest Breaks  -  0    MPH  -  2.98    METS  -  3.5    RPE  -  7    VO2 Peak  -  12.4    Symptoms  -  No    Resting HR  -  67 bpm    Resting BP  -  136/70    Resting Oxygen  Saturation   -  97 %    Exercise Oxygen Saturation  during 6 min walk  -  98 %    Max Ex. HR  -  102 bpm    Max Ex. BP  -  132/90    2 Minute Post BP  -  122/78       Oxygen Initial Assessment:   Oxygen Re-Evaluation:   Oxygen Discharge (Final Oxygen Re-Evaluation):   Initial Exercise Prescription: Initial Exercise Prescription - 02/18/17 1500      Date of Initial Exercise RX and Referring Provider   Date  02/18/17    Referring Provider  Dietrich Pates MD      Treadmill   MPH  2.4    Grade  1    Minutes  10    METs  3.17      Recumbant Bike   Level  2.5 upright scifit    Minutes  10    METs  2      NuStep   Level  3    SPM  80    Minutes  10    METs  2      Prescription Details   Frequency (times per week)  3    Duration  Progress to 30 minutes of continuous aerobic without signs/symptoms of physical distress      Intensity   THRR 40-80% of Max Heartrate  68-137    Ratings of Perceived Exertion  11-13    Perceived Dyspnea  0-4      Progression   Progression  Continue to progress workloads to maintain intensity without signs/symptoms of physical distress.      Resistance Training   Training Prescription  Yes    Weight  4lbs    Reps  10-15       Perform Capillary Blood Glucose checks as needed.  Exercise Prescription Changes:   Exercise Comments:   Exercise Goals and Review:  Exercise Goals    Row Name 02/18/17 1514             Exercise Goals   Increase Physical Activity  Yes       Intervention  Provide advice, education, support and counseling about physical activity/exercise needs.;Develop an individualized exercise prescription for aerobic and resistive training based on initial evaluation findings, risk stratification, comorbidities and participant's personal goals.       Expected Outcomes  Achievement of increased cardiorespiratory fitness and enhanced flexibility, muscular endurance and strength shown through measurements of functional  capacity and personal statement of participant.  Increase Strength and Stamina  Yes       Intervention  Provide advice, education, support and counseling about physical activity/exercise needs.;Develop an individualized exercise prescription for aerobic and resistive training based on initial evaluation findings, risk stratification, comorbidities and participant's personal goals.       Expected Outcomes  Achievement of increased cardiorespiratory fitness and enhanced flexibility, muscular endurance and strength shown through measurements of functional capacity and personal statement of participant.       Able to understand and use rate of perceived exertion (RPE) scale  Yes       Intervention  Provide education and explanation on how to use RPE scale       Expected Outcomes  Short Term: Able to use RPE daily in rehab to express subjective intensity level;Long Term:  Able to use RPE to guide intensity level when exercising independently       Knowledge and understanding of Target Heart Rate Range (THRR)  Yes       Intervention  Provide education and explanation of THRR including how the numbers were predicted and where they are located for reference       Expected Outcomes  Short Term: Able to state/look up THRR;Short Term: Able to use daily as guideline for intensity in rehab;Long Term: Able to use THRR to govern intensity when exercising independently       Able to check pulse independently  Yes       Intervention  Provide education and demonstration on how to check pulse in carotid and radial arteries.;Review the importance of being able to check your own pulse for safety during independent exercise       Expected Outcomes  Short Term: Able to explain why pulse checking is important during independent exercise;Long Term: Able to check pulse independently and accurately       Understanding of Exercise Prescription  Yes       Intervention  Provide education, explanation, and written materials on  patient's individual exercise prescription       Expected Outcomes  Short Term: Able to explain program exercise prescription;Long Term: Able to explain home exercise prescription to exercise independently          Exercise Goals Re-Evaluation :    Discharge Exercise Prescription (Final Exercise Prescription Changes):   Nutrition:  Target Goals: Understanding of nutrition guidelines, daily intake of sodium 1500mg , cholesterol 200mg , calories 30% from fat and 7% or less from saturated fats, daily to have 5 or more servings of fruits and vegetables.  Biometrics: Pre Biometrics - 02/18/17 1520      Pre Biometrics   Height  5' 8.75" (1.746 m)    Weight  299 lb 13.2 oz (136 kg)    Waist Circumference  51 inches    Hip Circumference  47 inches    Waist to Hip Ratio  1.09 %    BMI (Calculated)  44.61    Triceps Skinfold  18 mm    % Body Fat  39.2 %    Grip Strength  50 kg    Flexibility  0 in    Single Leg Stand  30 seconds        Nutrition Therapy Plan and Nutrition Goals:   Nutrition Discharge: Nutrition Scores:   Nutrition Goals Re-Evaluation:   Nutrition Goals Re-Evaluation:   Nutrition Goals Discharge (Final Nutrition Goals Re-Evaluation):   Psychosocial: Target Goals: Acknowledge presence or absence of significant depression and/or stress, maximize coping skills, provide positive support system. Participant  is able to verbalize types and ability to use techniques and skills needed for reducing stress and depression.  Initial Review & Psychosocial Screening: Initial Psych Review & Screening - 02/18/17 1639      Initial Review   Current issues with  Current Anxiety/Panic Jakylan says he sometimes becomes anxious when he has to be in a group of people.      Family Dynamics   Good Support System?  Yes Brently has his wife for support      Barriers   Psychosocial barriers to participate in program  The patient should benefit from training in stress management  and relaxation.      Screening Interventions   Interventions  Encouraged to exercise;To provide support and resources with identified psychosocial needs       Quality of Life Scores: Quality of Life - 02/18/17 1614      Quality of Life Scores   Health/Function Pre  19.27 %    Socioeconomic Pre  21.24 %    Psych/Spiritual Pre  15.43 %    Family Pre  21 %    GLOBAL Pre  19.06 %       PHQ-9: Recent Review Flowsheet Data    There is no flowsheet data to display.     Interpretation of Total Score  Total Score Depression Severity:  1-4 = Minimal depression, 5-9 = Mild depression, 10-14 = Moderate depression, 15-19 = Moderately severe depression, 20-27 = Severe depression   Psychosocial Evaluation and Intervention:   Psychosocial Re-Evaluation:   Psychosocial Discharge (Final Psychosocial Re-Evaluation):   Vocational Rehabilitation: Provide vocational rehab assistance to qualifying candidates.   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 Participant will state understanding/return demonstration of topics presented.  Learning Barriers/Preferences: Learning Barriers/Preferences - 02/18/17 1514      Learning Barriers/Preferences   Learning Barriers  None    Learning Preferences  Skilled Demonstration       Education Topics: Count Your Pulse:  -Group instruction provided by verbal instruction, demonstration, patient participation and written materials to support subject.  Instructors address importance of being able to find your pulse and how to count your pulse when at home without a heart monitor.  Patients get hands on  experience counting their pulse with staff help and individually.   Heart Attack, Angina, and Risk Factor Modification:  -Group instruction provided by verbal instruction, video, and written materials to support subject.  Instructors address signs and symptoms of angina and heart attacks.    Also discuss risk factors for heart disease and how to make changes to improve heart health risk factors.   Functional Fitness:  -Group instruction provided by verbal instruction, demonstration, patient participation, and written materials to support subject.  Instructors address safety measures for doing things around the house.  Discuss how to get up and down off the floor, how to pick things up properly, how to safely get out of a chair without assistance, and balance training.   Meditation and Mindfulness:  -Group instruction provided by verbal instruction, patient participation, and written materials to support subject.  Instructor addresses importance of mindfulness and meditation practice to help reduce  stress and improve awareness.  Instructor also leads participants through a meditation exercise.    Stretching for Flexibility and Mobility:  -Group instruction provided by verbal instruction, patient participation, and written materials to support subject.  Instructors lead participants through series of stretches that are designed to increase flexibility thus improving mobility.  These stretches are additional exercise for major muscle groups that are typically performed during regular warm up and cool down.   Hands Only CPR:  -Group verbal, video, and participation provides a basic overview of AHA guidelines for community CPR. Role-play of emergencies allow participants the opportunity to practice calling for help and chest compression technique with discussion of AED use.   Hypertension: -Group verbal and written instruction that provides a basic overview of hypertension including the most  recent diagnostic guidelines, risk factor reduction with self-care instructions and medication management.    Nutrition I class: Heart Healthy Eating:  -Group instruction provided by PowerPoint slides, verbal discussion, and written materials to support subject matter. The instructor gives an explanation and review of the Therapeutic Lifestyle Changes diet recommendations, which includes a discussion on lipid goals, dietary fat, sodium, fiber, plant stanol/sterol esters, sugar, and the components of a well-balanced, healthy diet.   Nutrition II class: Lifestyle Skills:  -Group instruction provided by PowerPoint slides, verbal discussion, and written materials to support subject matter. The instructor gives an explanation and review of label reading, grocery shopping for heart health, heart healthy recipe modifications, and ways to make healthier choices when eating out.   Diabetes Question & Answer:  -Group instruction provided by PowerPoint slides, verbal discussion, and written materials to support subject matter. The instructor gives an explanation and review of diabetes co-morbidities, pre- and post-prandial blood glucose goals, pre-exercise blood glucose goals, signs, symptoms, and treatment of hypoglycemia and hyperglycemia, and foot care basics.   Diabetes Blitz:  -Group instruction provided by PowerPoint slides, verbal discussion, and written materials to support subject matter. The instructor gives an explanation and review of the physiology behind type 1 and type 2 diabetes, diabetes medications and rational behind using different medications, pre- and post-prandial blood glucose recommendations and Hemoglobin A1c goals, diabetes diet, and exercise including blood glucose guidelines for exercising safely.    Portion Distortion:  -Group instruction provided by PowerPoint slides, verbal discussion, written materials, and food models to support subject matter. The instructor gives an  explanation of serving size versus portion size, changes in portions sizes over the last 20 years, and what consists of a serving from each food group.   Stress Management:  -Group instruction provided by verbal instruction, video, and written materials to support subject matter.  Instructors review role of stress in heart disease and how to cope with stress positively.     Exercising on Your Own:  -Group instruction provided by verbal instruction, power point, and written materials to support subject.  Instructors discuss benefits of exercise, components of exercise, frequency and intensity of exercise, and end points for exercise.  Also discuss use of nitroglycerin and activating EMS.  Review options of places to exercise outside of rehab.  Review guidelines for sex with heart disease.   Cardiac Drugs I:  -Group instruction provided by verbal instruction and written materials to support subject.  Instructor reviews cardiac drug classes: antiplatelets, anticoagulants, beta blockers, and statins.  Instructor discusses reasons, side effects, and lifestyle considerations for each drug class.   Cardiac Drugs II:  -Group instruction provided by verbal instruction and written materials to support subject.  Instructor reviews cardiac drug classes: angiotensin converting enzyme inhibitors (ACE-I), angiotensin II receptor blockers (ARBs), nitrates, and calcium channel blockers.  Instructor discusses reasons, side effects, and lifestyle considerations for each drug class.   Anatomy and Physiology of the Circulatory System:  Group verbal and written instruction and models provide basic cardiac anatomy and physiology, with the coronary electrical and arterial systems. Review of: AMI, Angina, Valve disease, Heart Failure, Peripheral Artery Disease, Cardiac Arrhythmia, Pacemakers, and the ICD.   Other Education:  -Group or individual verbal, written, or video instructions that support the educational  goals of the cardiac rehab program.   Knowledge Questionnaire Score: Knowledge Questionnaire Score - 02/18/17 1608      Knowledge Questionnaire Score   Pre Score  19/24       Core Components/Risk Factors/Patient Goals at Admission: Personal Goals and Risk Factors at Admission - 02/18/17 1606      Core Components/Risk Factors/Patient Goals on Admission    Weight Management  Yes;Obesity;Weight Maintenance;Weight Loss    Intervention  Weight Management/Obesity: Establish reasonable short term and long term weight goals.;Weight Management: Develop a combined nutrition and exercise program designed to reach desired caloric intake, while maintaining appropriate intake of nutrient and fiber, sodium and fats, and appropriate energy expenditure required for the weight goal.;Weight Management: Provide education and appropriate resources to help participant work on and attain dietary goals.;Obesity: Provide education and appropriate resources to help participant work on and attain dietary goals.    Admit Weight  299 lb 13.2 oz (136 kg)    Goal Weight: Short Term  289 lb (131.1 kg)    Goal Weight: Long Term  270 lb (122.5 kg)    Expected Outcomes  Short Term: Continue to assess and modify interventions until short term weight is achieved;Long Term: Adherence to nutrition and physical activity/exercise program aimed toward attainment of established weight goal;Weight Maintenance: Understanding of the daily nutrition guidelines, which includes 25-35% calories from fat, 7% or less cal from saturated fats, less than 200mg  cholesterol, less than 1.5gm of sodium, & 5 or more servings of fruits and vegetables daily;Weight Loss: Understanding of general recommendations for a balanced deficit meal plan, which promotes 1-2 lb weight loss per week and includes a negative energy balance of [phone] kcal/d;Understanding of distribution of calorie intake throughout the day with the consumption of 4-5  meals/snacks;Understanding recommendations for meals to include 15-35% energy as protein, 25-35% energy from fat, 35-60% energy from carbohydrates, less than 200mg  of dietary cholesterol, 20-35 gm of total fiber daily    Hypertension  Yes    Intervention  Provide education on lifestyle modifcations including regular physical activity/exercise, weight management, moderate sodium restriction and increased consumption of fresh fruit, vegetables, and low fat dairy, alcohol moderation, and smoking cessation.;Monitor prescription use compliance.    Expected Outcomes  Short Term: Continued assessment and intervention until BP is < 140/23mm HG in hypertensive participants. < 130/7mm HG in hypertensive participants with diabetes, heart failure or chronic kidney disease.;Long Term: Maintenance of blood pressure at goal levels.    Lipids  Yes    Intervention  Provide education and support for participant on nutrition & aerobic/resistive exercise along with prescribed medications to achieve LDL 70mg , HDL >40mg .    Expected Outcomes  Short Term: Participant states understanding of desired cholesterol values and is compliant with medications prescribed. Participant is following exercise prescription and nutrition guidelines.;Long Term: Cholesterol controlled with medications as prescribed, with individualized exercise RX and with personalized nutrition plan. Value goals: LDL <  70mg , HDL > 40 mg.       Core Components/Risk Factors/Patient Goals Review:    Core Components/Risk Factors/Patient Goals at Discharge (Final Review):    ITP Comments: ITP Comments    Row Name 02/18/17 1509           ITP Comments  Dr. Armanda Magicraci Turner, Medical Director          Comments: Timtohy attended orientation from 1329 to 1532 to review rules and guidelines for program. Completed 6 minute walk test, Intitial ITP, and exercise prescription.  VSS. Telemetry-Sinus Rhythm, tall t wave this has been previously documented.   Asymptomatic.Gladstone LighterMaria Whitaker, RN,BSN 02/18/2017 4:53 PM

## 2017-02-18 NOTE — Telephone Encounter (Signed)
New message   Pt c/o Shortness Of Breath: STAT if SOB developed within the last 24 hours or pt is noticeably SOB on the phone  1. Are you currently SOB (can you hear that pt is SOB on the phone)? Yes  2. How long have you been experiencing SOB? About a month  3. Are you SOB when sitting or when up moving around? When talking  4. Are you currently experiencing any other symptoms? No

## 2017-02-18 NOTE — Telephone Encounter (Signed)
Byrd HesselbachMaria, from cardiac rehab, called in to report pt symptom.  Pt reports "spontaneous SOB" for the last month.  This occurs a couple times a day with talking/exertion.  It does not occur at rest.   Byrd HesselbachMaria reports O2 sats are 98% on room air. Pt currently on Brilinta post PCI in November.  Will forward to Dr. Tenny Crawoss for advisement.

## 2017-02-18 NOTE — Telephone Encounter (Signed)
Can switch to Efflient Confirm with pharmaacy but I think direct switch to 10  No load needed Stop Brilinta

## 2017-02-19 MED ORDER — PRASUGREL HCL 10 MG PO TABS: 10.0000 mg | ORAL_TABLET | Freq: Every day | ORAL | 3 refills | Status: DC

## 2017-02-19 NOTE — Telephone Encounter (Signed)
Per PharmD, ok to switch to Effiient without loading dose if event was >1 month ago.  Pt had NSTEMI on 11/21/8. Keep aspirin dose at 81 mg per PharmD. Left message for patient to call back

## 2017-02-19 NOTE — Telephone Encounter (Signed)
Agree with plans

## 2017-02-19 NOTE — Progress Notes (Signed)
Fred Maxwell 52 y.o. male DOB: 07/15/1965 MRN: [id number]      Nutrition Note  1. NSTEMI (non-ST elevated myocardial infarction) (HCC)12/25/16   2. Status post coronary artery stent placement 12/25/16 S/P DES OM2    Past Medical History:  Diagnosis Date  . Coronary artery disease   . Gout   . Hx of gastroesophageal reflux (GERD)   . Hyperlipidemia   . Hypertension   . Joint pain   . Obesity    Meds reviewed.  HT: Ht Readings from Last 1 Encounters:  02/18/17 5' 8.75" (1.746 m)    WT: Wt Readings from Last 3 Encounters:  02/18/17 299 lb 13.2 oz (136 kg)  02/07/17 294 lb 3.2 oz (133.4 kg)  01/09/17 290 lb 6.4 oz (131.7 kg)     BMI 44.6  Current tobacco use? No, per pt  Labs:  Lipid Panel     Component Value Date/Time   CHOL 120 02/07/2017 1221   TRIG 143 02/07/2017 1221   HDL 36 (L) 02/07/2017 1221   CHOLHDL 3.3 02/07/2017 1221   CHOLHDL 4.8 12/26/2016 0010   VLDL 27 12/26/2016 0010   LDLCALC 55 02/07/2017 1221    Lab Results  Component Value Date   HGBA1C 5.5 12/26/2016   CBG (last 3)  No results for input(s): GLUCAP in the last 72 hours.  Nutrition Note Spoke with pt. Nutrition plan and goals reviewed with pt. Pt is following Step 2 of the Therapeutic Lifestyle Changes diet. Pt wants to lose wt. Wt loss tips reviewed. Pt is not diabetic but reports a family h/o DM. Association between pre-diabetes/DM and heart disease discussed. Pt c/o shortness of breath. Pt states he feels shortness of breath due in part to anxiety. Barnet Pall, RN, notified. Pt expressed understanding of the information reviewed. Pt aware of nutrition education classes offered and plans on attending nutrition classes.  Nutrition Diagnosis ? Food-and nutrition-related knowledge deficit related to lack of exposure to information as related to diagnosis of: ? CVD  ? Obesity related to excessive energy intake as evidenced by a BMI of 44.6  Nutrition Intervention ? Pt given information  re: meditation app and Tai Chi to help with stress management/possible anxiety. ? Pt's individual nutrition plan and goals reviewed with pt.  Nutrition Goal(s):  ? Pt to identify food quantities necessary to achieve weight loss of 6-24 lb (2.7-10.9 kg) at graduation from cardiac rehab.   Plan:  Pt to attend nutrition classes ? Nutrition I ? Nutrition II ? Portion Distortion  Will provide client-centered nutrition education as part of interdisciplinary care.   Monitor and evaluate progress toward nutrition goal with team.  Derek Mound, M.Ed, RD, LDN, CDE 02/19/2017 10:09 AM

## 2017-02-19 NOTE — Telephone Encounter (Signed)
Patient has caffeine during the day and with Brilinta and continues to have SOB spontaneously. He will take last dose of Brilnta this evening and start Effient once a day tomorrow morning.  Reports chest pain he noticed during work this AM.  Left sided.  Intermittent. Does not radiate.   Can press directly on the area of his chest and reproduce the pain.  He took one NTG this AM and thought it eased up.  Is concerned it could be cardiac.   Advised it is unlikely that it is his heart if he can reproduce with pressure to the area.  Advised to try tylenol ES two tabs up to 3 times per day.  Advised it is always ok to go into ED for evaluation if he has further concerns.  He will try tylenol.  Feels like it is musculoskeletal but was afraid to dismiss the pain and miss something.  Advised to call back if tylenol is ineffective or if pain continues or if he needs to use more NTG.

## 2017-02-20 NOTE — Telephone Encounter (Signed)
Patient called.  Cannot afford $189 for 30 days of Effient. Checked for samples and/or copay card in office --we do not have any.   Pt's wife checking online for coupons and thinks she found one.  Will take to pharmacy.  Will let me know the outcome.  In the meantime will continue Brilinta.  Aware to try with a small amount of caffiene to see if SOB improves.

## 2017-02-20 NOTE — Telephone Encounter (Signed)
New message      Pt c/o medication issue:  1. Name of Medication:  prasugrel (EFFIENT) 10 MG TABS tablet Take 1 tablet (10 mg total) by mouth daily.     2. How are you currently taking this medication (dosage and times per day)? no  3. Are you having a reaction (difficulty breathing--STAT)? no 4. What is your medication issue?  To expensive , do you have a coupon or samples for it

## 2017-02-24 ENCOUNTER — Encounter (HOSPITAL_COMMUNITY): Admission: RE | Admit: 2017-02-24 | Payer: 59 | Source: Ambulatory Visit

## 2017-02-26 ENCOUNTER — Encounter (HOSPITAL_COMMUNITY): Payer: Self-pay | Admitting: *Deleted

## 2017-02-26 ENCOUNTER — Telehealth (HOSPITAL_COMMUNITY): Payer: Self-pay | Admitting: Family Medicine

## 2017-02-26 ENCOUNTER — Encounter (HOSPITAL_COMMUNITY): Payer: 59

## 2017-02-26 DIAGNOSIS — Z955 Presence of coronary angioplasty implant and graft: Secondary | ICD-10-CM

## 2017-02-26 DIAGNOSIS — I214 Non-ST elevation (NSTEMI) myocardial infarction: Secondary | ICD-10-CM

## 2017-02-26 NOTE — Progress Notes (Signed)
Cardiac Individual Treatment Plan  Patient Details  Name: Fred Maxwell MRN: [id number] Date of Birth: Sep 05, 1965 Referring Provider:     CARDIAC REHAB PHASE II ORIENTATION from 02/18/2017 in [facility] CARDIAC REHAB  Referring Provider  Dietrich Pates MD      Initial Encounter Date:    CARDIAC REHAB PHASE II ORIENTATION from 02/18/2017 in [facility] CARDIAC REHAB  Date  02/18/17  Referring Provider  Dietrich Pates MD      Visit Diagnosis: NSTEMI (non-ST elevated myocardial infarction) (HCC)12/25/16  Status post coronary artery stent placement 12/25/16 S/P DES OM2  Patient's Home Medications on Admission:  Current Outpatient Medications:  .  aspirin EC 81 MG EC tablet, Take 1 tablet (81 mg total) by mouth daily., Disp: 90 tablet, Rfl: 3 .  atorvastatin (LIPITOR) 80 MG tablet, Take 1 tablet (80 mg total) by mouth daily at 6 PM., Disp: 90 tablet, Rfl: 3 .  Docusate Calcium (STOOL SOFTENER PO), Take 1 capsule by mouth every other day., Disp: , Rfl:  .  fluticasone (FLONASE) 50 MCG/ACT nasal spray, Place 2 sprays into both nostrils daily as needed for allergies or rhinitis., Disp: , Rfl:  .  ibuprofen (ADVIL,MOTRIN) 200 MG tablet, Take 600-800 mg by mouth every 6 (six) hours as needed for headache or mild pain., Disp: , Rfl:  .  irbesartan (AVAPRO) 300 MG tablet, Take 1 tablet (300 mg total) by mouth daily., Disp: 90 tablet, Rfl: 3 .  metoprolol tartrate (LOPRESSOR) 25 MG tablet, TAKE 3 TABLETS BY MOUTH TWICE DAILY, Disp: 180 tablet, Rfl: 3 .  nitroGLYCERIN (NITROSTAT) 0.4 MG SL tablet, Place 1 tablet (0.4 mg total) under the tongue every 5 (five) minutes as needed for chest pain., Disp: 25 tablet, Rfl: 12 .  prasugrel (EFFIENT) 10 MG TABS tablet, Take 1 tablet (10 mg total) by mouth daily., Disp: 90 tablet, Rfl: 3 .  Sennosides-Docusate Sodium (PERI-COLACE PO), Take by mouth as needed., Disp: , Rfl:   Past Medical History: Past Medical History:   Diagnosis Date  . Coronary artery disease   . Gout   . Hx of gastroesophageal reflux (GERD)   . Hyperlipidemia   . Hypertension   . Joint pain   . Obesity     Tobacco Use: Social History   Tobacco Use  Smoking Status Unknown If Ever Smoked  Smokeless Tobacco Never Used    Labs: Recent Review Advice worker    Labs for ITP Cardiac and Pulmonary Rehab Latest Ref Rng & Units 12/25/2016 12/26/2016 02/07/2017   Cholestrol 100 - 199 mg/dL [phone]   LDLCALC 0 - 99 mg/dL 782(N) 95 55   HDL >56 mg/dL 21(H) 08(M) 57(Q)   Trlycerides 0 - 149 mg/dL [phone]   Hemoglobin A1c 4.8 - 5.6 % 5.6 5.5 -      Capillary Blood Glucose: No results found for: GLUCAP   Exercise Target Goals:    Exercise Program Goal: Individual exercise prescription set using results from initial 6 min walk test and THRR while considering  patient's activity barriers and safety.   Exercise Prescription Goal: Initial exercise prescription builds to 30-45 minutes a day of aerobic activity, 2-3 days per week.  Home exercise guidelines will be given to patient during program as part of exercise prescription that the participant will acknowledge.  Activity Barriers & Risk Stratification: Activity Barriers & Cardiac Risk Stratification - 02/18/17 1525      Activity Barriers & Cardiac Risk Stratification  Activity Barriers  Deconditioning;Muscular Weakness;Other (comment)    Comments  L ankle gout    Cardiac Risk Stratification  High       6 Minute Walk: 6 Minute Walk    Row Name 02/18/17 1514 02/18/17 1521       6 Minute Walk   Phase  Initial  -    Distance  -  1572 feet    Walk Time  -  6 minutes    # of Rest Breaks  -  0    MPH  -  2.98    METS  -  3.5    RPE  -  7    VO2 Peak  -  12.4    Symptoms  -  No    Resting HR  -  67 bpm    Resting BP  -  136/70    Resting Oxygen Saturation   -  97 %    Exercise Oxygen Saturation  during 6 min walk  -  98 %    Max Ex. HR  -  102 bpm    Max  Ex. BP  -  132/90    2 Minute Post BP  -  122/78       Oxygen Initial Assessment:   Oxygen Re-Evaluation:   Oxygen Discharge (Final Oxygen Re-Evaluation):   Initial Exercise Prescription: Initial Exercise Prescription - 02/18/17 1500      Date of Initial Exercise RX and Referring Provider   Date  02/18/17    Referring Provider  Dietrich Pates MD      Treadmill   MPH  2.4    Grade  1    Minutes  10    METs  3.17      Recumbant Bike   Level  2.5 upright scifit    Minutes  10    METs  2      NuStep   Level  3    SPM  80    Minutes  10    METs  2      Prescription Details   Frequency (times per week)  3    Duration  Progress to 30 minutes of continuous aerobic without signs/symptoms of physical distress      Intensity   THRR 40-80% of Max Heartrate  68-137    Ratings of Perceived Exertion  11-13    Perceived Dyspnea  0-4      Progression   Progression  Continue to progress workloads to maintain intensity without signs/symptoms of physical distress.      Resistance Training   Training Prescription  Yes    Weight  4lbs    Reps  10-15       Perform Capillary Blood Glucose checks as needed.  Exercise Prescription Changes:   Exercise Comments:   Exercise Goals and Review:  Exercise Goals    Row Name 02/18/17 1514             Exercise Goals   Increase Physical Activity  Yes       Intervention  Provide advice, education, support and counseling about physical activity/exercise needs.;Develop an individualized exercise prescription for aerobic and resistive training based on initial evaluation findings, risk stratification, comorbidities and participant's personal goals.       Expected Outcomes  Achievement of increased cardiorespiratory fitness and enhanced flexibility, muscular endurance and strength shown through measurements of functional capacity and personal statement of participant.       Increase Strength and  Stamina  Yes       Intervention   Provide advice, education, support and counseling about physical activity/exercise needs.;Develop an individualized exercise prescription for aerobic and resistive training based on initial evaluation findings, risk stratification, comorbidities and participant's personal goals.       Expected Outcomes  Achievement of increased cardiorespiratory fitness and enhanced flexibility, muscular endurance and strength shown through measurements of functional capacity and personal statement of participant.       Able to understand and use rate of perceived exertion (RPE) scale  Yes       Intervention  Provide education and explanation on how to use RPE scale       Expected Outcomes  Short Term: Able to use RPE daily in rehab to express subjective intensity level;Long Term:  Able to use RPE to guide intensity level when exercising independently       Knowledge and understanding of Target Heart Rate Range (THRR)  Yes       Intervention  Provide education and explanation of THRR including how the numbers were predicted and where they are located for reference       Expected Outcomes  Short Term: Able to state/look up THRR;Short Term: Able to use daily as guideline for intensity in rehab;Long Term: Able to use THRR to govern intensity when exercising independently       Able to check pulse independently  Yes       Intervention  Provide education and demonstration on how to check pulse in carotid and radial arteries.;Review the importance of being able to check your own pulse for safety during independent exercise       Expected Outcomes  Short Term: Able to explain why pulse checking is important during independent exercise;Long Term: Able to check pulse independently and accurately       Understanding of Exercise Prescription  Yes       Intervention  Provide education, explanation, and written materials on patient's individual exercise prescription       Expected Outcomes  Short Term: Able to explain program  exercise prescription;Long Term: Able to explain home exercise prescription to exercise independently          Exercise Goals Re-Evaluation :    Discharge Exercise Prescription (Final Exercise Prescription Changes):   Nutrition:  Target Goals: Understanding of nutrition guidelines, daily intake of sodium 1500mg , cholesterol 200mg , calories 30% from fat and 7% or less from saturated fats, daily to have 5 or more servings of fruits and vegetables.  Biometrics: Pre Biometrics - 02/18/17 1520      Pre Biometrics   Height  5' 8.75" (1.746 m)    Weight  299 lb 13.2 oz (136 kg)    Waist Circumference  51 inches    Hip Circumference  47 inches    Waist to Hip Ratio  1.09 %    BMI (Calculated)  44.61    Triceps Skinfold  18 mm    % Body Fat  39.2 %    Grip Strength  50 kg    Flexibility  0 in    Single Leg Stand  30 seconds        Nutrition Therapy Plan and Nutrition Goals: Nutrition Therapy & Goals - 02/19/17 1157      Nutrition Therapy   Diet  Heart Healthy      Personal Nutrition Goals   Nutrition Goal  Pt to identify food quantities necessary to achieve weight loss of 6-24 lb (2.7-10.9 kg) at  graduation from cardiac rehab.       Intervention Plan   Intervention  Prescribe, educate and counsel regarding individualized specific dietary modifications aiming towards targeted core components such as weight, hypertension, lipid management, diabetes, heart failure and other comorbidities.    Expected Outcomes  Short Term Goal: Understand basic principles of dietary content, such as calories, fat, sodium, cholesterol and nutrients.;Long Term Goal: Adherence to prescribed nutrition plan.       Nutrition Assessments: Nutrition Assessments - 02/19/17 1157      MEDFICTS Scores   Pre Score  32       Nutrition Goals Re-Evaluation:   Nutrition Goals Re-Evaluation:   Nutrition Goals Discharge (Final Nutrition Goals Re-Evaluation):   Psychosocial: Target Goals:  Acknowledge presence or absence of significant depression and/or stress, maximize coping skills, provide positive support system. Participant is able to verbalize types and ability to use techniques and skills needed for reducing stress and depression.  Initial Review & Psychosocial Screening: Initial Psych Review & Screening - 02/18/17 1639      Initial Review   Current issues with  Current Anxiety/Panic Fawaz says he sometimes becomes anxious when he has to be in a group of people.      Family Dynamics   Good Support System?  Yes Skippy has his wife for support      Barriers   Psychosocial barriers to participate in program  The patient should benefit from training in stress management and relaxation.      Screening Interventions   Interventions  Encouraged to exercise;To provide support and resources with identified psychosocial needs       Quality of Life Scores: Quality of Life - 02/18/17 1614      Quality of Life Scores   Health/Function Pre  19.27 %    Socioeconomic Pre  21.24 %    Psych/Spiritual Pre  15.43 %    Family Pre  21 %    GLOBAL Pre  19.06 %      Scores of 19 and below usually indicate a poorer quality of life in these areas.  A difference of  2-3 points is a clinically meaningful difference.  A difference of 2-3 points in the total score of the Quality of Life Index has been associated with significant improvement in overall quality of life, self-image, physical symptoms, and general health in studies assessing change in quality of life.  PHQ-9: Recent Review Flowsheet Data    There is no flowsheet data to display.     Interpretation of Total Score  Total Score Depression Severity:  1-4 = Minimal depression, 5-9 = Mild depression, 10-14 = Moderate depression, 15-19 = Moderately severe depression, 20-27 = Severe depression   Psychosocial Evaluation and Intervention:   Psychosocial Re-Evaluation:   Psychosocial Discharge (Final Psychosocial  Re-Evaluation):   Vocational Rehabilitation: Provide vocational rehab assistance to qualifying candidates.   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 Participant will state understanding/return demonstration of topics presented.  Learning Barriers/Preferences: Learning Barriers/Preferences - 02/18/17 1514      Learning Barriers/Preferences   Learning Barriers  None    Learning Preferences  Skilled Demonstration  Education Topics: Count Your Pulse:  -Group instruction provided by verbal instruction, demonstration, patient participation and written materials to support subject.  Instructors address importance of being able to find your pulse and how to count your pulse when at home without a heart monitor.  Patients get hands on experience counting their pulse with staff help and individually.   Heart Attack, Angina, and Risk Factor Modification:  -Group instruction provided by verbal instruction, video, and written materials to support subject.  Instructors address signs and symptoms of angina and heart attacks.    Also discuss risk factors for heart disease and how to make changes to improve heart health risk factors.   Functional Fitness:  -Group instruction provided by verbal instruction, demonstration, patient participation, and written materials to support subject.  Instructors address safety measures for doing things around the house.  Discuss how to get up and down off the floor, how to pick things up properly, how to safely get out of a chair without assistance, and balance training.   Meditation and Mindfulness:  -Group  instruction provided by verbal instruction, patient participation, and written materials to support subject.  Instructor addresses importance of mindfulness and meditation practice to help reduce stress and improve awareness.  Instructor also leads participants through a meditation exercise.    Stretching for Flexibility and Mobility:  -Group instruction provided by verbal instruction, patient participation, and written materials to support subject.  Instructors lead participants through series of stretches that are designed to increase flexibility thus improving mobility.  These stretches are additional exercise for major muscle groups that are typically performed during regular warm up and cool down.   Hands Only CPR:  -Group verbal, video, and participation provides a basic overview of AHA guidelines for community CPR. Role-play of emergencies allow participants the opportunity to practice calling for help and chest compression technique with discussion of AED use.   Hypertension: -Group verbal and written instruction that provides a basic overview of hypertension including the most recent diagnostic guidelines, risk factor reduction with self-care instructions and medication management.    Nutrition I class: Heart Healthy Eating:  -Group instruction provided by PowerPoint slides, verbal discussion, and written materials to support subject matter. The instructor gives an explanation and review of the Therapeutic Lifestyle Changes diet recommendations, which includes a discussion on lipid goals, dietary fat, sodium, fiber, plant stanol/sterol esters, sugar, and the components of a well-balanced, healthy diet.   Nutrition II class: Lifestyle Skills:  -Group instruction provided by PowerPoint slides, verbal discussion, and written materials to support subject matter. The instructor gives an explanation and review of label reading, grocery shopping for heart health, heart healthy recipe  modifications, and ways to make healthier choices when eating out.   Diabetes Question & Answer:  -Group instruction provided by PowerPoint slides, verbal discussion, and written materials to support subject matter. The instructor gives an explanation and review of diabetes co-morbidities, pre- and post-prandial blood glucose goals, pre-exercise blood glucose goals, signs, symptoms, and treatment of hypoglycemia and hyperglycemia, and foot care basics.   Diabetes Blitz:  -Group instruction provided by PowerPoint slides, verbal discussion, and written materials to support subject matter. The instructor gives an explanation and review of the physiology behind type 1 and type 2 diabetes, diabetes medications and rational behind using different medications, pre- and post-prandial blood glucose recommendations and Hemoglobin A1c goals, diabetes diet, and exercise including blood glucose guidelines for exercising safely.    Portion Distortion:  -Group instruction provided by PowerPoint slides,  verbal discussion, written materials, and food models to support subject matter. The instructor gives an explanation of serving size versus portion size, changes in portions sizes over the last 20 years, and what consists of a serving from each food group.   Stress Management:  -Group instruction provided by verbal instruction, video, and written materials to support subject matter.  Instructors review role of stress in heart disease and how to cope with stress positively.     Exercising on Your Own:  -Group instruction provided by verbal instruction, power point, and written materials to support subject.  Instructors discuss benefits of exercise, components of exercise, frequency and intensity of exercise, and end points for exercise.  Also discuss use of nitroglycerin and activating EMS.  Review options of places to exercise outside of rehab.  Review guidelines for sex with heart disease.   Cardiac Drugs I:   -Group instruction provided by verbal instruction and written materials to support subject.  Instructor reviews cardiac drug classes: antiplatelets, anticoagulants, beta blockers, and statins.  Instructor discusses reasons, side effects, and lifestyle considerations for each drug class.   Cardiac Drugs II:  -Group instruction provided by verbal instruction and written materials to support subject.  Instructor reviews cardiac drug classes: angiotensin converting enzyme inhibitors (ACE-I), angiotensin II receptor blockers (ARBs), nitrates, and calcium channel blockers.  Instructor discusses reasons, side effects, and lifestyle considerations for each drug class.   Anatomy and Physiology of the Circulatory System:  Group verbal and written instruction and models provide basic cardiac anatomy and physiology, with the coronary electrical and arterial systems. Review of: AMI, Angina, Valve disease, Heart Failure, Peripheral Artery Disease, Cardiac Arrhythmia, Pacemakers, and the ICD.   Other Education:  -Group or individual verbal, written, or video instructions that support the educational goals of the cardiac rehab program.   Knowledge Questionnaire Score: Knowledge Questionnaire Score - 02/18/17 1608      Knowledge Questionnaire Score   Pre Score  19/24       Core Components/Risk Factors/Patient Goals at Admission: Personal Goals and Risk Factors at Admission - 02/18/17 1606      Core Components/Risk Factors/Patient Goals on Admission    Weight Management  Yes;Obesity;Weight Maintenance;Weight Loss    Intervention  Weight Management/Obesity: Establish reasonable short term and long term weight goals.;Weight Management: Develop a combined nutrition and exercise program designed to reach desired caloric intake, while maintaining appropriate intake of nutrient and fiber, sodium and fats, and appropriate energy expenditure required for the weight goal.;Weight Management: Provide education and  appropriate resources to help participant work on and attain dietary goals.;Obesity: Provide education and appropriate resources to help participant work on and attain dietary goals.    Admit Weight  299 lb 13.2 oz (136 kg)    Goal Weight: Short Term  289 lb (131.1 kg)    Goal Weight: Long Term  270 lb (122.5 kg)    Expected Outcomes  Short Term: Continue to assess and modify interventions until short term weight is achieved;Long Term: Adherence to nutrition and physical activity/exercise program aimed toward attainment of established weight goal;Weight Maintenance: Understanding of the daily nutrition guidelines, which includes 25-35% calories from fat, 7% or less cal from saturated fats, less than 200mg  cholesterol, less than 1.5gm of sodium, & 5 or more servings of fruits and vegetables daily;Weight Loss: Understanding of general recommendations for a balanced deficit meal plan, which promotes 1-2 lb weight loss per week and includes a negative energy balance of [phone] kcal/d;Understanding of distribution of calorie  intake throughout the day with the consumption of 4-5 meals/snacks;Understanding recommendations for meals to include 15-35% energy as protein, 25-35% energy from fat, 35-60% energy from carbohydrates, less than 200mg  of dietary cholesterol, 20-35 gm of total fiber daily    Hypertension  Yes    Intervention  Provide education on lifestyle modifcations including regular physical activity/exercise, weight management, moderate sodium restriction and increased consumption of fresh fruit, vegetables, and low fat dairy, alcohol moderation, and smoking cessation.;Monitor prescription use compliance.    Expected Outcomes  Short Term: Continued assessment and intervention until BP is < 140/5490mm HG in hypertensive participants. < 130/1780mm HG in hypertensive participants with diabetes, heart failure or chronic kidney disease.;Long Term: Maintenance of blood pressure at goal levels.    Lipids  Yes     Intervention  Provide education and support for participant on nutrition & aerobic/resistive exercise along with prescribed medications to achieve LDL 70mg , HDL >40mg .    Expected Outcomes  Short Term: Participant states understanding of desired cholesterol values and is compliant with medications prescribed. Participant is following exercise prescription and nutrition guidelines.;Long Term: Cholesterol controlled with medications as prescribed, with individualized exercise RX and with personalized nutrition plan. Value goals: LDL < 70mg , HDL > 40 mg.       Core Components/Risk Factors/Patient Goals Review:    Core Components/Risk Factors/Patient Goals at Discharge (Final Review):    ITP Comments: ITP Comments    Row Name 02/18/17 1509 02/26/17 1807         ITP Comments  Dr. Armanda Magicraci Turner, Medical Director  30 day ITP review. Altin is to begin exercise next week.         Comments: See ITP comments.Gladstone LighterMaria Alylah Blakney, RN,BSN 02/26/2017 6:10 PM

## 2017-02-28 ENCOUNTER — Encounter (HOSPITAL_COMMUNITY): Payer: 59

## 2017-03-03 ENCOUNTER — Encounter (HOSPITAL_COMMUNITY)
Admission: RE | Admit: 2017-03-03 | Discharge: 2017-03-03 | Disposition: A | Discharge status: Home / Self Care | Payer: 59 | Source: Ambulatory Visit | Attending: Cardiology | Admitting: Cardiology

## 2017-03-03 VITALS — BP 122/78 | HR 86 | Wt 298.9 lb

## 2017-03-03 DIAGNOSIS — Z955 Presence of coronary angioplasty implant and graft: Secondary | ICD-10-CM | POA: Diagnosis present

## 2017-03-03 DIAGNOSIS — I214 Non-ST elevation (NSTEMI) myocardial infarction: Secondary | ICD-10-CM | POA: Diagnosis present

## 2017-03-03 DIAGNOSIS — Z7982 Long term (current) use of aspirin: Secondary | ICD-10-CM | POA: Diagnosis not present

## 2017-03-03 DIAGNOSIS — K219 Gastro-esophageal reflux disease without esophagitis: Secondary | ICD-10-CM | POA: Diagnosis not present

## 2017-03-03 DIAGNOSIS — M109 Gout, unspecified: Secondary | ICD-10-CM | POA: Diagnosis not present

## 2017-03-03 DIAGNOSIS — Z79899 Other long term (current) drug therapy: Secondary | ICD-10-CM | POA: Diagnosis not present

## 2017-03-03 DIAGNOSIS — E785 Hyperlipidemia, unspecified: Secondary | ICD-10-CM | POA: Diagnosis not present

## 2017-03-03 DIAGNOSIS — I10 Essential (primary) hypertension: Secondary | ICD-10-CM | POA: Diagnosis not present

## 2017-03-03 DIAGNOSIS — I2510 Atherosclerotic heart disease of native coronary artery without angina pectoris: Secondary | ICD-10-CM | POA: Diagnosis not present

## 2017-03-03 MED ORDER — TICAGRELOR 90 MG PO TABS: 90.0000 mg | ORAL_TABLET | Freq: Two times a day (BID) | ORAL | 11 refills | Status: DC

## 2017-03-03 NOTE — Addendum Note (Signed)
Addended by: Lendon KaWILSON, Emmry Hinsch on: 03/03/2017 05:59 PM   Modules accepted: Orders

## 2017-03-03 NOTE — Telephone Encounter (Signed)
I had switched to Effient with complaints of SOB I was not aware of increased copay OK to stay on Brilinta

## 2017-03-03 NOTE — Progress Notes (Signed)
Daily Session Note  Patient Details  Name: Fred Maxwell MRN: [id number] Date of Birth: 1965/12/25 Referring Provider:     CARDIAC REHAB PHASE II ORIENTATION from 02/18/2017 in [facility]  Referring Provider  Dorris Carnes MD      Encounter Date: 03/03/2017  Check In: Session Check In - 03/03/17 1523      Check-In   Staff Present  Su Hilt, MS, ACSM RCEP, Exercise Physiologist;Tyara Nevels, MS,ACSM CEP, Exercise Physiologist;Barbi Kumagai, RN, BSN    Supervising physician immediately available to respond to emergencies  [facility] immediately available    Physician(s)  Dr. Bonner Puna    Medication changes reported      No    Fall or balance concerns reported     No    Tobacco Cessation  No Change    Warm-up and Cool-down  Performed as group-led instruction    Resistance Training Performed  Yes    VAD Patient?  No      Pain Assessment   Currently in Pain?  No/denies    Multiple Pain Sites  No       Capillary Blood Glucose: No results found for this or any previous visit (from the past 24 hour(s)).    Social History   Tobacco Use  Smoking Status Unknown If Ever Smoked  Smokeless Tobacco Never Used    Goals Met:  Exercise tolerated well  Goals Unmet:  Not Applicable  Comments: Fred Maxwell started cardiac rehab today.  Pt tolerated light exercise without difficulty. VSS, telemetry-Sinus Rhtyhm, asymptomatic.  Medication list reconciled. Pt denies barriers to medicaiton compliance.  PSYCHOSOCIAL ASSESSMENT:  PHQ-0. Pt exhibits positive coping skills, hopeful outlook with supportive family. No psychosocial needs identified at this time, no psychosocial interventions necessary.    Pt enjoys working on his car.   Pt oriented to exercise equipment and routine.    Understanding verbalized. Fred Maxwell says he is taking Brilinta twice a day rather than the Effient because the Brilinta cost his $5 a month where as the Effient will cost him $187 a  month. Fred Maxwell the pharmacist at Dr Alan Ripper office called and notified and talked with Fred Maxwell over the phone.Fred Pall, RN,BSN 03/03/2017 4:15 PM   Dr. Fransico Him is Medical Director for Cardiac Rehab at [facility].

## 2017-03-03 NOTE — Telephone Encounter (Signed)
Updated medication list. Left patient detailed message to stay on Brilinta two times per day and call with any other concerns or questions.

## 2017-03-03 NOTE — Telephone Encounter (Signed)
Fred Maxwell from Cardiac rehab called over from rehab today with [name], requesting to speak to a pharmacist. She reports that patient would like to remain on Brilinta as copay is lower with this medication than the Effient. He reports that he has not started Effient and Byrd HesselbachMaria is requesting that I change patient back to Brilinta (he has not missed a dose). Advised that Dr. Tenny Crawoss would need to make change since she initially made change due to SOB. Byrd HesselbachMaria puts the patient on the phone and when asked if he has SOB with the Brilinta he replies "I am answering no because otherwise you will make me change." Again advised that I was not the one that changed the medication and that our office did so to help him not because we intended for him to pay more. He states he understands that but would like to remain on Brilinta. Advised I would route to Dr. Tenny Crawoss and [facility] for further recommendations and to address. He states understanding.

## 2017-03-03 NOTE — Progress Notes (Signed)
QUALITY OF LIFE SCORE REVIEW  Adolfo completed Quality of Life survey as a participant in Cardiac Rehab. Scores 21.0 or below are considered low. Pt score very low in several areas Overall 19.06, Health and Function 19.27, socioeconomic 21.14, physiological and spiritual 15.43, family 21.00. Patient quality of life slightly altered by physical constraints which limits ability to perform as prior to recent cardiac illness.   Offered emotional support and reassurance.  Will continue to monitor and intervene as necessary. Jani denies being depressed but admits that he sometimes feels anxious. Will fax quality of life questionnaire to Dr. Charlott Rakesoss's office for review.Gladstone LighterMaria Everlena Mackley, RN,BSN 03/03/2017 4:31 PM

## 2017-03-05 ENCOUNTER — Encounter (HOSPITAL_COMMUNITY): Payer: 59

## 2017-03-05 ENCOUNTER — Telehealth (HOSPITAL_COMMUNITY): Payer: Self-pay | Admitting: *Deleted

## 2017-03-07 ENCOUNTER — Encounter (HOSPITAL_COMMUNITY): Payer: 59

## 2017-03-10 ENCOUNTER — Encounter (HOSPITAL_COMMUNITY): Payer: 59

## 2017-03-12 ENCOUNTER — Encounter (HOSPITAL_COMMUNITY): Payer: 59 | Attending: Cardiology

## 2017-03-12 DIAGNOSIS — E785 Hyperlipidemia, unspecified: Secondary | ICD-10-CM | POA: Insufficient documentation

## 2017-03-12 DIAGNOSIS — Z955 Presence of coronary angioplasty implant and graft: Secondary | ICD-10-CM | POA: Insufficient documentation

## 2017-03-12 DIAGNOSIS — M109 Gout, unspecified: Secondary | ICD-10-CM | POA: Insufficient documentation

## 2017-03-12 DIAGNOSIS — K219 Gastro-esophageal reflux disease without esophagitis: Secondary | ICD-10-CM | POA: Insufficient documentation

## 2017-03-12 DIAGNOSIS — I2510 Atherosclerotic heart disease of native coronary artery without angina pectoris: Secondary | ICD-10-CM | POA: Insufficient documentation

## 2017-03-12 DIAGNOSIS — I214 Non-ST elevation (NSTEMI) myocardial infarction: Secondary | ICD-10-CM | POA: Insufficient documentation

## 2017-03-12 DIAGNOSIS — I10 Essential (primary) hypertension: Secondary | ICD-10-CM | POA: Insufficient documentation

## 2017-03-12 DIAGNOSIS — Z7982 Long term (current) use of aspirin: Secondary | ICD-10-CM | POA: Insufficient documentation

## 2017-03-12 DIAGNOSIS — Z79899 Other long term (current) drug therapy: Secondary | ICD-10-CM | POA: Insufficient documentation

## 2017-03-14 ENCOUNTER — Telehealth (HOSPITAL_COMMUNITY): Payer: Self-pay | Admitting: *Deleted

## 2017-03-14 ENCOUNTER — Encounter (HOSPITAL_COMMUNITY): Payer: 59

## 2017-03-17 ENCOUNTER — Encounter (HOSPITAL_COMMUNITY): Payer: 59

## 2017-03-19 ENCOUNTER — Encounter (HOSPITAL_COMMUNITY): Payer: 59

## 2017-03-19 ENCOUNTER — Telehealth (HOSPITAL_COMMUNITY): Payer: Self-pay | Admitting: Family Medicine

## 2017-03-21 ENCOUNTER — Encounter (HOSPITAL_COMMUNITY): Payer: Self-pay | Admitting: *Deleted

## 2017-03-21 ENCOUNTER — Encounter (HOSPITAL_COMMUNITY): Payer: 59

## 2017-03-21 NOTE — Progress Notes (Signed)
Fred Maxwell will not be able to return to exercise at cardiac rehab due to his work schedule. Fred Maxwell attended 2 sessions including orientation. Will discharge.Fred LighterMaria Imane Burrough, RN,BSN 03/21/2017 9:14 AM

## 2017-03-24 ENCOUNTER — Encounter (HOSPITAL_COMMUNITY): Payer: 59

## 2017-03-26 ENCOUNTER — Encounter (HOSPITAL_COMMUNITY): Payer: 59

## 2017-03-28 ENCOUNTER — Encounter (HOSPITAL_COMMUNITY): Payer: 59

## 2017-03-31 ENCOUNTER — Encounter (HOSPITAL_COMMUNITY): Payer: 59

## 2017-04-01 NOTE — Addendum Note (Signed)
Encounter addended by: Artist Paisichards, Kallyn Demarcus M on: 04/01/2017 10:30 AM  Actions taken: Flowsheet data copied forward, Visit Navigator Flowsheet section accepted, Vitals modified

## 2017-04-02 ENCOUNTER — Encounter (HOSPITAL_COMMUNITY): Payer: 59

## 2017-04-04 ENCOUNTER — Other Ambulatory Visit: Payer: Self-pay | Admitting: *Deleted

## 2017-04-04 ENCOUNTER — Encounter (HOSPITAL_COMMUNITY): Payer: 59

## 2017-04-04 MED ORDER — METOPROLOL TARTRATE 25 MG PO TABS: 75.0000 mg | ORAL_TABLET | Freq: Two times a day (BID) | ORAL | 2 refills | Status: DC

## 2017-04-07 ENCOUNTER — Encounter (HOSPITAL_COMMUNITY): Payer: 59

## 2017-04-09 ENCOUNTER — Encounter (HOSPITAL_COMMUNITY): Payer: 59

## 2017-04-11 ENCOUNTER — Encounter (HOSPITAL_COMMUNITY): Payer: 59

## 2017-04-14 ENCOUNTER — Encounter (HOSPITAL_COMMUNITY): Payer: 59

## 2017-04-16 ENCOUNTER — Encounter (HOSPITAL_COMMUNITY): Payer: 59

## 2017-04-18 ENCOUNTER — Encounter: Payer: Self-pay | Admitting: Internal Medicine

## 2017-04-18 ENCOUNTER — Encounter (HOSPITAL_COMMUNITY): Payer: 59

## 2017-04-21 ENCOUNTER — Encounter (HOSPITAL_COMMUNITY): Payer: 59

## 2017-04-23 ENCOUNTER — Encounter (HOSPITAL_COMMUNITY): Payer: 59

## 2017-04-25 ENCOUNTER — Encounter (HOSPITAL_COMMUNITY): Payer: 59

## 2017-04-28 ENCOUNTER — Encounter: Payer: Self-pay | Admitting: Internal Medicine

## 2017-04-28 ENCOUNTER — Ambulatory Visit: Payer: 59 | Admitting: Internal Medicine

## 2017-04-28 ENCOUNTER — Encounter (HOSPITAL_COMMUNITY): Payer: 59

## 2017-04-28 VITALS — BP 138/86 | HR 65 | Ht 70.0 in | Wt 304.8 lb

## 2017-04-28 DIAGNOSIS — E782 Mixed hyperlipidemia: Secondary | ICD-10-CM

## 2017-04-28 DIAGNOSIS — I2510 Atherosclerotic heart disease of native coronary artery without angina pectoris: Secondary | ICD-10-CM | POA: Diagnosis not present

## 2017-04-28 DIAGNOSIS — I10 Essential (primary) hypertension: Secondary | ICD-10-CM | POA: Diagnosis not present

## 2017-04-28 MED ORDER — METOPROLOL TARTRATE 25 MG PO TABS: 50.0000 mg | ORAL_TABLET | Freq: Two times a day (BID) | ORAL | 2 refills | Status: DC

## 2017-04-28 MED ORDER — AMLODIPINE BESYLATE 2.5 MG PO TABS: 2.5000 mg | ORAL_TABLET | Freq: Two times a day (BID) | ORAL | 3 refills | Status: DC

## 2017-04-28 MED ORDER — PANTOPRAZOLE SODIUM 40 MG PO TBEC: 40.0000 mg | DELAYED_RELEASE_TABLET | Freq: Every day | ORAL | 3 refills | Status: DC

## 2017-04-28 NOTE — Patient Instructions (Signed)
Your physician has recommended you make the following change in your medication:  1.) amlodipine 2.5 mg--take 1 tablet by mouth daily, then increase to 1 tablet two times a day--when you switch to twice a day--decrease metoprolol to 50 mg twice a day for blood pressure  2.) protonix 40 mg once a day for reflux  Your physician recommends that you schedule a follow-up appointment in: June, 2019 with Dr. Tenny Crawoss.

## 2017-04-28 NOTE — Progress Notes (Signed)
Cardiology Office Note   Date:  04/28/2017   ID:  Fred Maxwell, DOB 1965-10-21, MRN [id number]  PCP:  Clayborn Heron, MD  Cardiologist:   Dietrich Pates, MD    F/U of CAD    History of Present Illness: Fred Maxwell is a 52 y.o. male with a history of GERD, gout, obesity, FHX of CAD , tob   Admitted with CP on 11/21  Suffered NSTEMI    Cardiac cath done   This showed    2nd RPLB lesion is 85% stenosed.  Ost 2nd Mrg to 2nd Mrg lesion is 100% stenosed.  Prox LAD lesion is 40% stenosed.  The left ventricular systolic function is normal.  LV end diastolic pressure is mildly elevated.  The left ventricular ejection fraction is 55-65% by visual estimate.  A drug-eluting stent was successfully placed using a STENT RESOLUTE ONYX 2.0X30.  Post intervention, there is a 0% residual stenosis.  2nd Mrg lesion is 40% stenosed.  Recommendations: Dual antiplatelet therapy for at least one year.  Aggressive treatment of risk factors and cardiac rehab. RPL2 is not suitable for PCI given tortuous segment.  The rest of his coronary artery disease should be treated medically.  I saw the pt in December   Since then he has done well from a cardiac standpoint   He could not do cardiac rehab due to work    He denies CP  Breathing is OK  He says since d/c he has lacked energy     He dos admit to being anxious about his health. Current Meds  Medication Sig  . allopurinol (ZYLOPRIM) 100 MG tablet Take 100 mg by mouth daily.   Marland Kitchen aspirin EC 81 MG EC tablet Take 1 tablet (81 mg total) by mouth daily.  Marland Kitchen atorvastatin (LIPITOR) 80 MG tablet Take 1 tablet (80 mg total) by mouth daily at 6 PM.  . Docusate Calcium (STOOL SOFTENER PO) Take 1 capsule by mouth every other day.  . fluticasone (FLONASE) 50 MCG/ACT nasal spray Place 2 sprays into both nostrils daily as needed for allergies or rhinitis.  Marland Kitchen ibuprofen (ADVIL,MOTRIN) 200 MG tablet Take 600-800 mg by mouth every 6 (six) hours as needed  for headache or mild pain.  Marland Kitchen irbesartan (AVAPRO) 300 MG tablet Take 1 tablet (300 mg total) by mouth daily.  . metoprolol tartrate (LOPRESSOR) 25 MG tablet Take 3 tablets (75 mg total) by mouth 2 (two) times daily.  . nitroGLYCERIN (NITROSTAT) 0.4 MG SL tablet Place 1 tablet (0.4 mg total) under the tongue every 5 (five) minutes as needed for chest pain.  Bernadette Hoit Sodium (PERI-COLACE PO) Take by mouth as needed.  . ticagrelor (BRILINTA) 90 MG TABS tablet Take 1 tablet (90 mg total) by mouth 2 (two) times daily.     Allergies:   Morphine and related   Past Medical History:  Diagnosis Date  . Coronary artery disease   . Gout   . Hx of gastroesophageal reflux (GERD)   . Hyperlipidemia   . Hypertension   . Joint pain   . Obesity     Past Surgical History:  Procedure Laterality Date  . CARDIAC CATHETERIZATION    . CORONARY STENT INTERVENTION N/A 12/25/2016   Procedure: CORONARY STENT INTERVENTION;  Surgeon: Iran Ouch, MD;  Location: [facility];  Service: Cardiovascular;  Laterality: N/A;  . LEFT HEART CATH AND CORONARY ANGIOGRAPHY N/A 12/25/2016   Procedure: LEFT HEART CATH AND CORONARY ANGIOGRAPHY;  Surgeon: Kirke Corin,  Chelsea AusMuhammad A, MD;  Location: [facility];  Service: Cardiovascular;  Laterality: N/A;     Social History:  The patient  reports that he has never smoked. He has never used smokeless tobacco. He reports that he does not drink alcohol or use drugs.   Family History:  The patient's family history includes Heart attack in his maternal grandmother and mother; Other in his mother.    ROS:  Please see the history of present illness. All other systems are reviewed and  Negative to the above problem except as noted.    PHYSICAL EXAM: VS:  BP 138/86   Pulse 65   Ht 5\' 10"  (1.778 m)   Wt (!) 304 lb 12.8 oz (138.3 kg)   SpO2 98%   BMI 43.73 kg/m   GEN: Morbidly obese 52 yo in no acute distress  HEENT: normal  Neck: JVP normal  No carotid  bruits, or masses Cardiac: RRR; no murmurs, rubs, or gallops,no edema  Respiratory:  clear to auscultation bilaterally, normal work of breathing GI: soft, nontender, nondistended, + BS  No hepatomegaly  MS: no deformity Moving all extremities   Skin: warm and dry, no rash Neuro:  Strength and sensation are intact Psych: euthymic mood, full affect   EKG:  EKG is not  ordered today.   Lipid Panel    Component Value Date/Time   CHOL 120 02/07/2017 1221   TRIG 143 02/07/2017 1221   HDL 36 (L) 02/07/2017 1221   CHOLHDL 3.3 02/07/2017 1221   CHOLHDL 4.8 12/26/2016 0010   VLDL 27 12/26/2016 0010   LDLCALC 55 02/07/2017 1221      Wt Readings from Last 3 Encounters:  04/28/17 (!) 304 lb 12.8 oz (138.3 kg)  03/03/17 298 lb 15.1 oz (135.6 kg)  02/18/17 299 lb 13.2 oz (136 kg)      ASSESSMENT AND PLAN:  1  CAD   Pt denies symptoms of angina   He is anxious  I have encouraged him to stay/increase activity   Cut back on metoprolol  May help with energy     2  HL  Keep on lipitor   Lipids are gould    3  HTN  BP is up   I would recomm adding metoprolol   Increase to bid   Then swithc metoprolol to 50 bid     4  Morbid obesity  Discussed wt loss with diet and exercise  Ramp up activity   Check CBC and lipids   F?U at end of march       Current medicines are reviewed at length with the patient today.  The patient does not have concerns regarding medicines.  Signed, Dietrich PatesPaula Ross, MD  04/28/2017 8:27 AM    [facility] [address], [place], [place]  [address] Phone: [phone]; Fax: [phone]

## 2017-04-30 ENCOUNTER — Encounter (HOSPITAL_COMMUNITY): Payer: 59

## 2017-05-02 ENCOUNTER — Encounter (HOSPITAL_COMMUNITY): Payer: 59

## 2017-05-05 ENCOUNTER — Encounter (HOSPITAL_COMMUNITY): Payer: 59

## 2017-05-07 ENCOUNTER — Encounter (HOSPITAL_COMMUNITY): Payer: 59

## 2017-05-09 ENCOUNTER — Encounter (HOSPITAL_COMMUNITY): Payer: 59

## 2017-05-12 ENCOUNTER — Encounter (HOSPITAL_COMMUNITY): Payer: 59

## 2017-05-14 ENCOUNTER — Encounter (HOSPITAL_COMMUNITY): Payer: 59

## 2017-05-16 ENCOUNTER — Encounter (HOSPITAL_COMMUNITY): Payer: 59

## 2017-05-22 ENCOUNTER — Telehealth: Payer: Self-pay | Admitting: Internal Medicine

## 2017-05-22 DIAGNOSIS — R5383 Other fatigue: Secondary | ICD-10-CM

## 2017-05-22 DIAGNOSIS — R002 Palpitations: Secondary | ICD-10-CM

## 2017-05-22 NOTE — Telephone Encounter (Signed)
Set up for regular treadmill to monitor HR and BP response on meds

## 2017-05-22 NOTE — Telephone Encounter (Signed)
Order placed for ETT. Left detailed message (ok per pt) of recommendation for exercise tolerance test to assess BP/HR.  Advised to take all meds like normal, do not ea/drink for 4 hours prior to the test.  Advised scheduler would call him to schedule this test.  Asked him to call back with any questions in the meantime.

## 2017-05-22 NOTE — Telephone Encounter (Signed)
New Message   Pt c/o medication issue:  1. Name of Medication: pt states its one of his bp medications but not sure of the name because he is at work  2. How are you currently taking this medication (dosage and times per day)? n/a  3. Are you having a reaction (difficulty breathing--STAT)? Fatigue, sweats, and heart palps  4. What is your medication issue? discuss with a nurse

## 2017-05-22 NOTE — Telephone Encounter (Signed)
Very very very tired.  Can barely keep eyes open at times.  In chest no pain but feels like butterflies all the time.  Dizziness.with activities at work.  No BP or heart rate readings.  Sometimes feels like its pounding.  Like skipped beats.    Worse since last seen on 3/25, when amlodipine was started and metoprolol was decreased.  Drinks approx 4 cups of coffee daily and iced tea.  No alcohol.  About 64 oz water a day.  Pt aware I will review with Dr. Tenny Crawoss and call him back with recommendations.

## 2017-05-27 ENCOUNTER — Ambulatory Visit (INDEPENDENT_AMBULATORY_CARE_PROVIDER_SITE_OTHER): Payer: 59

## 2017-05-27 DIAGNOSIS — R002 Palpitations: Secondary | ICD-10-CM | POA: Diagnosis not present

## 2017-05-27 DIAGNOSIS — R5383 Other fatigue: Secondary | ICD-10-CM

## 2017-05-27 LAB — EXERCISE TOLERANCE TEST
Estimated workload: 6.1 METS
Exercise duration (min): 4 min
Exercise duration (sec): 18 s
MPHR: 170 {beats}/min
Peak HR: 130 {beats}/min
Percent HR: 76 %
RPE: 15
Rest HR: 86 {beats}/min

## 2017-05-28 ENCOUNTER — Telehealth: Payer: Self-pay | Admitting: *Deleted

## 2017-05-28 MED ORDER — METOPROLOL TARTRATE 25 MG PO TABS: 50.0000 mg | ORAL_TABLET | Freq: Two times a day (BID) | ORAL | 6 refills | Status: DC

## 2017-05-28 MED ORDER — AMLODIPINE BESYLATE 2.5 MG PO TABS: 2.5000 mg | ORAL_TABLET | Freq: Two times a day (BID) | ORAL | 6 refills | Status: DC

## 2017-05-28 NOTE — Telephone Encounter (Signed)
I spoke with the patient's wife.  She informed me that the directions for metoprolol were incorrect on patient's new medicine bottle.  Changed from 75 mg BID to 50 mg BID at last ov.  The directions were correct in medicine list but this was never sent to his pharmacy.  I have now sent a new refill to his pharmacy with correct directions so that the next time meds are picked up these directions will be correct.     Per patient's wife he had been taking the correct dose, but the actual directions on the bottle were not corrected.

## 2017-06-03 ENCOUNTER — Other Ambulatory Visit: Payer: Self-pay

## 2017-07-07 ENCOUNTER — Encounter (INDEPENDENT_AMBULATORY_CARE_PROVIDER_SITE_OTHER): Payer: Self-pay

## 2017-07-07 ENCOUNTER — Ambulatory Visit: Payer: 59 | Admitting: Internal Medicine

## 2017-07-07 ENCOUNTER — Encounter: Payer: Self-pay | Admitting: Internal Medicine

## 2017-07-07 VITALS — BP 140/86 | HR 75 | Ht 70.0 in | Wt 311.0 lb

## 2017-07-07 DIAGNOSIS — I10 Essential (primary) hypertension: Secondary | ICD-10-CM | POA: Diagnosis not present

## 2017-07-07 DIAGNOSIS — I2510 Atherosclerotic heart disease of native coronary artery without angina pectoris: Secondary | ICD-10-CM

## 2017-07-07 DIAGNOSIS — E782 Mixed hyperlipidemia: Secondary | ICD-10-CM

## 2017-07-07 MED ORDER — AMLODIPINE BESYLATE 5 MG PO TABS: 5.0000 mg | ORAL_TABLET | Freq: Every day | ORAL | 3 refills | Status: DC

## 2017-07-07 MED ORDER — METOPROLOL SUCCINATE ER 100 MG PO TB24: 100.0000 mg | ORAL_TABLET | Freq: Every day | ORAL | 3 refills | Status: DC

## 2017-07-07 NOTE — Progress Notes (Signed)
Cardiology Office Note   Date:  07/07/2017   ID:  Fred Maxwell, DOB 01/21/1966, MRN [id number]  PCP:  Clayborn Heronankins, Victoria R, MD  Cardiologist:   Dietrich PatesPaula Deyna Carbon, MD    F/U of CAD    History of Present Illness: Fred PihDarren Maxwell is a 52 y.o. male with a history of GERD, gout, obesity, FHX of CAD , tob   Admitted with CP on 11/21  Suffered NSTEMI    Cardiac cath done   This showed    2nd RPLB lesion is 85% stenosed.  Ost 2nd Mrg to 2nd Mrg lesion is 100% stenosed.  Prox LAD lesion is 40% stenosed.  The left ventricular systolic function is normal.  LV end diastolic pressure is mildly elevated.  The left ventricular ejection fraction is 55-65% by visual estimate.  A drug-eluting stent was successfully placed using a STENT RESOLUTE ONYX 2.0X30.  Post intervention, there is a 0% residual stenosis.  2nd Mrg lesion is 40% stenosed.  Recommendations: Dual antiplatelet therapy for at least one year.   RPL2 is not suitable for PCI given tortuous segment.  The rest of his coronary artery disease should be treated medically.  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 allopurinol (ZYLOPRIM) 100 MG tablet Take 100 mg by mouth daily.   Marland Kitchen. amLODipine (NORVASC) 2.5 MG tablet Take 1 tablet (2.5 mg total) by mouth 2 (two) times daily.  Marland Kitchen. aspirin EC 81 MG EC tablet Take 1 tablet (81 mg total) by mouth daily.  Marland Kitchen. atorvastatin (LIPITOR) 80 MG tablet Take 1 tablet (80 mg total) by mouth daily at 6 PM.  . Docusate Calcium (STOOL SOFTENER PO) Take 1 capsule by mouth every other day.  . fluticasone (FLONASE) 50 MCG/ACT nasal spray Place 2 sprays into both nostrils daily as  needed for allergies or rhinitis.  Marland Kitchen. ibuprofen (ADVIL,MOTRIN) 200 MG tablet Take 600-800 mg by mouth every 6 (six) hours as needed for headache or mild pain.  Marland Kitchen. irbesartan (AVAPRO) 300 MG tablet Take 1 tablet (300 mg total) by mouth daily.  . metoprolol tartrate (LOPRESSOR) 25 MG tablet Take 2 tablets (50 mg total) by mouth 2 (two) times daily.  . nitroGLYCERIN (NITROSTAT) 0.4 MG SL tablet Place 1 tablet (0.4 mg total) under the tongue every 5 (five) minutes as needed for chest pain.  . pantoprazole (PROTONIX) 40 MG tablet Take 1 tablet (40 mg total) by mouth daily.  Bernadette Hoit. Sennosides-Docusate Sodium (PERI-COLACE PO) Take by mouth as needed.  . ticagrelor (BRILINTA) 90 MG TABS tablet Take 1 tablet (90 mg total) by mouth 2 (two) times daily.     Allergies:   Morphine and related   Past Medical History:  Diagnosis Date  . Coronary artery disease   . Gout   . Hx of gastroesophageal reflux (GERD)   . Hyperlipidemia   . Hypertension   . Joint pain   . Obesity     Past Surgical History:  Procedure Laterality Date  . CARDIAC CATHETERIZATION    . CORONARY STENT INTERVENTION N/A 12/25/2016   Procedure:  CORONARY STENT INTERVENTION;  Surgeon: Iran Ouch, MD;  Location: [facility];  Service: Cardiovascular;  Laterality: N/A;  . LEFT HEART CATH AND CORONARY ANGIOGRAPHY N/A 12/25/2016   Procedure: LEFT HEART CATH AND CORONARY ANGIOGRAPHY;  Surgeon: Iran Ouch, MD;  Location: [facility];  Service: Cardiovascular;  Laterality: N/A;     Social History:  The patient  reports that he has never smoked. He has never used smokeless tobacco. He reports that he does not drink alcohol or use drugs.   Family History:  The patient's family history includes Heart attack in his maternal grandmother and mother; Other in his mother.    ROS:  Please see the history of present illness. All other systems are reviewed and  Negative to the above problem except as noted.    PHYSICAL  EXAM: VS:  BP 140/86   Pulse 75   Ht 5\' 10"  (1.778 m)   Wt (!) 141.1 kg (311 lb)   SpO2 96%   BMI 44.62 kg/m   GEN: Morbidly obese 52 yo in no acute distress  HEENT: normal  Neck: JVP not elevated  l  No carotid bruits, or masses Cardiac: RRR; no murmurs, rubs, or gallops,no edema  Respiratory:  clear to auscultation bilaterally, normal work of breathing GI: soft, nontender, nondistended, + BS  No hepatomegaly  MS: no deformity Moving all extremities   Skin: warm and dry, no rash Neuro:  Strength and sensation are intact Psych: euthymic mood, full affect   EKG:  EKG is not  ordered today.   Lipid Panel    Component Value Date/Time   CHOL 120 02/07/2017 1221   TRIG 143 02/07/2017 1221   HDL 36 (L) 02/07/2017 1221   CHOLHDL 3.3 02/07/2017 1221   CHOLHDL 4.8 12/26/2016 0010   VLDL 27 12/26/2016 0010   LDLCALC 55 02/07/2017 1221      Wt Readings from Last 3 Encounters:  07/07/17 (!) 141.1 kg (311 lb)  04/28/17 (!) 138.3 kg (304 lb 12.8 oz)  03/03/17 135.6 kg (298 lb 15.1 oz)      ASSESSMENT AND PLAN:  1  CAD   I am not convinced symtpom of clavicular pain represents  angina.  Keep on same regimen   WIll  Consolidate to once daily meds  2  HL  Keep on lipitor   Lipids are good on last check LDL 55    3.  HTN  BP is fair   WIll switcfh to amldopine 5 and Toprol XL 100    Daily dose so does not forget   4  Morbid obesity Looks like on talking to him that diet is overall godd   Watch portion sizes  Limit carbs  Increase activity    Check CBC today    F?U in November         Current medicines are reviewed at length with the patient today.  The patient does not have concerns regarding medicines.  Signed, Dietrich Pates, MD  07/07/2017 3:07 PM    [facility] [address] [place], [place], [place]  [address] Phone: [phone]; Fax: [phone]

## 2017-07-07 NOTE — Patient Instructions (Addendum)
Your physician has recommended you make the following change in your medication:  1.) stop metoprolol tartrate 2.) start metoprolol succinate 100 mg --one tablet once daily 3.) stop amlodipine 2.5 mg  4.) start amlodipine 5 mg --one tablet once daily  Your physician recommends that you return for lab work today (CBC)  Your physician wants you to follow-up in: November, 2019 with Dr. Tenny Crawoss.  You will receive a reminder letter in the mail two months in advance. If you don't receive a letter, please call our office to schedule the follow-up appointment.

## 2017-07-08 ENCOUNTER — Telehealth: Payer: Self-pay | Admitting: Internal Medicine

## 2017-07-08 ENCOUNTER — Encounter: Payer: Self-pay | Admitting: Internal Medicine

## 2017-07-08 LAB — CBC
Hematocrit: 38.1 % (ref 37.5–51.0)
Hemoglobin: 13.1 g/dL (ref 13.0–17.7)
MCH: 31.6 pg (ref 26.6–33.0)
MCHC: 34.4 g/dL (ref 31.5–35.7)
MCV: 92 fL (ref 79–97)
Platelets: 177 10*3/uL (ref 150–450)
RBC: 4.14 x10E6/uL (ref 4.14–5.80)
RDW: 13.9 % (ref 12.3–15.4)
WBC: 8 10*3/uL (ref 3.4–10.8)

## 2017-07-08 NOTE — Telephone Encounter (Signed)
New Message    Patients wife is calling in reference to the patients appointment yesterday. She states that some medicines were changed so she wants to be clear as to what the patient should be or should not be taking. Please call.

## 2017-07-08 NOTE — Telephone Encounter (Signed)
Pt spouse states current medication list is not correct. She states Dr. Tenny Crawoss instructed pt to stop metoprolol tartrate and start metoprolol succinate 100 mg once a day and both medications are listed on patient's med list. I informed pt per 07/07/17 OV that is correct and med list has been updated. She stated understanding and thankful for the call

## 2017-07-09 ENCOUNTER — Encounter: Payer: Self-pay | Admitting: Internal Medicine

## 2017-09-12 ENCOUNTER — Telehealth: Payer: Self-pay | Admitting: Internal Medicine

## 2017-09-12 NOTE — Telephone Encounter (Signed)
New Message        Patient calling the office for samples of medication:   1.  What medication and dosage are you requesting samples for?Brilinta  2.  Are you currently out of this medication? No, out in a couple [id number] weeks

## 2017-09-12 NOTE — Telephone Encounter (Signed)
Spoke with patient and he stated that he is using a copay card but he will be without insurance coverage for a few weeks and he is requesting at least two weeks of samples. He is aware that I will place these at the front desk.

## 2017-12-05 ENCOUNTER — Other Ambulatory Visit: Payer: Self-pay

## 2017-12-05 MED ORDER — ATORVASTATIN CALCIUM 80 MG PO TABS: 80.0000 mg | ORAL_TABLET | Freq: Every day | ORAL | 3 refills | Status: DC

## 2017-12-05 MED ORDER — IRBESARTAN 300 MG PO TABS: 300.0000 mg | ORAL_TABLET | Freq: Every day | ORAL | 3 refills | Status: DC

## 2018-01-06 ENCOUNTER — Other Ambulatory Visit: Payer: Self-pay | Admitting: Internal Medicine

## 2018-01-06 MED ORDER — TICAGRELOR 90 MG PO TABS: 90.0000 mg | ORAL_TABLET | Freq: Two times a day (BID) | ORAL | 1 refills | Status: DC

## 2018-02-03 ENCOUNTER — Encounter: Payer: Self-pay | Admitting: Physician Assistant

## 2018-02-03 ENCOUNTER — Ambulatory Visit: Payer: BLUE CROSS/BLUE SHIELD | Admitting: Physician Assistant

## 2018-02-03 ENCOUNTER — Telehealth: Payer: Self-pay | Admitting: *Deleted

## 2018-02-03 VITALS — BP 104/68 | HR 65 | Ht 70.0 in | Wt 325.8 lb

## 2018-02-03 DIAGNOSIS — R0683 Snoring: Secondary | ICD-10-CM

## 2018-02-03 DIAGNOSIS — I2510 Atherosclerotic heart disease of native coronary artery without angina pectoris: Secondary | ICD-10-CM | POA: Diagnosis not present

## 2018-02-03 DIAGNOSIS — E785 Hyperlipidemia, unspecified: Secondary | ICD-10-CM | POA: Diagnosis not present

## 2018-02-03 DIAGNOSIS — I10 Essential (primary) hypertension: Secondary | ICD-10-CM | POA: Diagnosis not present

## 2018-02-03 HISTORY — DX: Atherosclerotic heart disease of native coronary artery without angina pectoris: I25.10

## 2018-02-03 NOTE — Progress Notes (Signed)
Cardiology Office Note:    Date:  02/03/2018   ID:  Fred Maxwell, DOB 04/17/1965, MRN [id number]  PCP:  Clayborn Heronankins, Victoria R, MD  Cardiologist:  Dietrich PatesPaula Ross, MD   Electrophysiologist:  None   Referring MD: Clayborn Heronankins, Victoria R, MD   Chief Complaint  Patient presents with  . Follow-up    CAD     History of Present Illness:    Fred Maxwell is a 52 y.o. male with coronary artery disease, hypertension, hyperlipidemia.  He was admitted in 12/2016 with a NSTEMI.  He had an occluded OM2 and high grade disease in the RPLB2.  The OM2 was treated with a DES.  The RPLB2 stenosis is treated medically (not suitable for PCI due to tortuosity).  GXT in 4/19 was submaximal but did not demonstrate any ischemic ST changes.  He was last seen by Dr. Tenny Crawoss in 07/2017.     Fred Maxwell returns for follow up.  He is here alone.  Since last seen, he has not had any chest pain.  He has shortness of breath at times but this is overall stable.  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  He does a lot of heavy lifting without limitations.  He has not had any paroxysmal nocturnal dyspnea, syncope, leg swelling, bleeding issues.    Prior CV studies:   The following studies were reviewed today:  GXT 05/27/17 ETT with poor exercise tolerance (4:18); no chest pain; normal BP response; no ST changes; negative inadequate ETT (pt achieved 76% THR); Duke treadmill score 4.  Cardiac Catheterization 12/25/16 LM normal  LAD prox 40; D1 irregs LCx normal; OM1 normal; OM2 100, 40 RCA irregs; RPDA mild dz, RPLB2 85 EF 55-65 PCI:  2 x 30 mm Resolute Onyx DES to OM2 RPLB2 not suitable for PCI given tortuous segement.  Past Medical History:  Diagnosis Date  . CAD (coronary artery disease) 02/03/2018   NSTEMI 11/18:  LM normal; LAD prox 40; D1 irregs; LCx normal; OM1 normal; OM2 100, 40; RCA irregs; RPDA mild dz, RPLB2 85; EF 55-65 >> PCI:  2 x 30 mm Resolute Onyx DES to OM2 (RPLB2 not suitable for PCI given  tortuous segement).   . Gout   . Hx of gastroesophageal reflux (GERD)   . Hx of NSTEMI in 12/2016 tx with DES to OM2 12/25/2016  . Hyperlipidemia   . Hypertension   . Joint pain   . Obesity    Surgical Hx: The patient  has a past surgical history that includes LEFT HEART CATH AND CORONARY ANGIOGRAPHY (N/A, 12/25/2016); CORONARY STENT INTERVENTION (N/A, 12/25/2016); and Cardiac catheterization.   Current Medications: Current Meds  Medication Sig  . allopurinol (ZYLOPRIM) 100 MG tablet Take 100 mg by mouth daily.   Marland Kitchen. amLODipine (NORVASC) 5 MG tablet Take 1 tablet (5 mg total) by mouth daily.  Marland Kitchen. aspirin EC 81 MG EC tablet Take 1 tablet (81 mg total) by mouth daily.  Marland Kitchen. atorvastatin (LIPITOR) 80 MG tablet Take 1 tablet (80 mg total) by mouth daily at 6 PM.  . buPROPion (WELLBUTRIN XL) 150 MG 24 hr tablet Take 150 mg by mouth daily.  Tery Sanfilippo. Docusate Calcium (STOOL SOFTENER PO) Take 1 capsule by mouth as needed.   . fluticasone (FLONASE) 50 MCG/ACT nasal spray Place 2 sprays into both nostrils daily as needed for allergies or rhinitis.  Marland Kitchen. ibuprofen (ADVIL,MOTRIN) 200 MG tablet Take 600-800 mg by mouth every 6 (six) hours as needed for headache or mild  pain.  . irbesartan (AVAPRO) 300 MG tablet Take 1 tablet (300 mg total) by mouth daily.  . metoprolol succinate (TOPROL-XL) 100 MG 24 hr tablet Take 1 tablet (100 mg total) by mouth daily. Take with or immediately following a meal.  . nitroGLYCERIN (NITROSTAT) 0.4 MG SL tablet Place 1 tablet (0.4 mg total) under the tongue every 5 (five) minutes as needed for chest pain.  . pantoprazole (PROTONIX) 40 MG tablet Take 1 tablet (40 mg total) by mouth daily.  Bernadette Hoit. Sennosides-Docusate Sodium (PERI-COLACE PO) Take by mouth as needed.  . [DISCONTINUED] ticagrelor (BRILINTA) 90 MG TABS tablet Take 1 tablet (90 mg total) by mouth 2 (two) times daily.     Allergies:   Morphine and related   Social History   Tobacco Use  . Smoking status: Never Smoker  .  Smokeless tobacco: Never Used  Substance Use Topics  . Alcohol use: No    Frequency: Never  . Drug use: No     Family Hx: The patient's family history includes Heart attack in his maternal grandmother and mother; Other in his mother.  ROS:   Please see the history of present illness.    Review of Systems  Constitution: Positive for malaise/fatigue and weight gain.  Respiratory: Positive for cough.   Musculoskeletal: Positive for back pain.  Psychiatric/Behavioral: The patient is nervous/anxious.    All other systems reviewed and are negative.   EKGs/Labs/Other Test Reviewed:    EKG:  EKG is  ordered today.  The ekg ordered today demonstrates NSR, HR 65, leftward axis, QTc 453, no acute changes.   Recent Labs: 07/07/2017: Hemoglobin 13.1; Platelets 177   Recent Lipid Panel Lab Results  Component Value Date/Time   CHOL 120 02/07/2017 12:21 PM   TRIG 143 02/07/2017 12:21 PM   HDL 36 (L) 02/07/2017 12:21 PM   CHOLHDL 3.3 02/07/2017 12:21 PM   CHOLHDL 4.8 12/26/2016 12:10 AM   LDLCALC 55 02/07/2017 12:21 PM   From [facility] Tool: Cholesterol, total 101.000 01/05/2018 HDL 30.000 01/05/2018 LDL 53.000 01/05/2018 Triglycerides 90.000 01/05/2018 A1C 5.500 12/26/2016 Hemoglobin 13.100 G/ 07/07/2017 Creatinine, Serum 0.860 01/05/2018 Potassium 4.400 01/05/2018 ALT (SGPT) 23.000 01/05/2018 INR 0.970 12/25/2016 Platelets 177.000 07/07/2017   Physical Exam:    VS:  BP 104/68   Pulse 65   Ht 5\' 10"  (1.778 m)   Wt (!) 325 lb 12.8 oz (147.8 kg)   SpO2 93%   BMI 46.75 kg/m     Wt Readings from Last 3 Encounters:  02/03/18 (!) 325 lb 12.8 oz (147.8 kg)  07/07/17 (!) 311 lb (141.1 kg)  04/28/17 (!) 304 lb 12.8 oz (138.3 kg)     Physical Exam  Constitutional: He is oriented to person, place, and time. He appears well-developed and well-nourished. No distress.  HENT:  Head: Normocephalic and atraumatic.  Eyes: No scleral icterus.  Neck: No JVD present. No thyromegaly present.    Cardiovascular: Normal rate and regular rhythm.  No murmur heard. Pulmonary/Chest: Effort normal. He has no rales.  Abdominal: Soft.  Lymphadenopathy:    He has no cervical adenopathy.  Neurological: He is alert and oriented to person, place, and time.  Skin: Skin is warm and dry.  Psychiatric: He has a normal mood and affect.    ASSESSMENT & PLAN:    Coronary artery disease involving native coronary artery of native heart without angina pectoris S/p NSTEMI in 12/2016 treated with a DES to the OM2.  He has high grade disease in the  RPLB2 that is treated medically due to vessel tortuosity.  He is > 1 year out from his PCI.  He can now come off of Brilinta.    -Finish Brilinta and stop  -Continue ASA, beta-blocker, angiotensin receptor blocker, statin  -Follow up in 1 year  Essential hypertension The patient's blood pressure is controlled on his current regimen.  Continue current therapy.    Hyperlipidemia, unspecified hyperlipidemia type LDL optimal on most recent lab work.  Continue current Rx.    Snoring He has a hx of snoring and his wife has witnessed apnea.  I suspect he has obstructive sleep apnea.  Will obtain a home sleep study.  Morbid obesity (HCC) We discussed different strategies for losing weight.    Dispo:  Return in about 1 year (around 02/04/2019) for Routine Follow Up, w/ Dr. Tenny Craw, or Tereso Newcomer, PA-C.   Medication Adjustments/Labs and Tests Ordered: Current medicines are reviewed at length with the patient today.  Concerns regarding medicines are outlined above.  Tests Ordered: Orders Placed This Encounter  Procedures  . EKG 12-Lead  . Home sleep test   Medication Changes: No orders of the defined types were placed in this encounter.   Signed, Tereso Newcomer, PA-C  02/03/2018 3:53 PM    [facility] [address] [place], [place], [place]  [address] Phone: [phone]; Fax: [phone]

## 2018-02-03 NOTE — Patient Instructions (Addendum)
Medication Instructions:  Doreatha MartinFinish your current bottle of Brilinta and then stop it  If you need a refill on your cardiac medications before your next appointment, please call your pharmacy.   Lab work: None  If you have labs (blood work) drawn today and your tests are completely normal, you will receive your results only by: Marland Kitchen. MyChart Message (if you have MyChart) OR . A paper copy in the mail If you have any lab test that is abnormal or we need to change your treatment, we will call you to review the results.  Testing/Procedures:  You have been ordered to have a home sleep study someone will contact you from our sleep department   Follow-Up: At [facility], you and your health needs are our priority.  As part of our continuing mission to provide you with exceptional heart care, we have created designated Provider Care Teams.  These Care Teams include your primary Cardiologist (physician) and Advanced Practice Providers (APPs -  Physician Assistants and Nurse Practitioners) who all work together to provide you with the care you need, when you need it. You will need a follow up appointment in:  1 years.  Please call our office 2 months in advance to schedule this appointment.  You may see Dietrich PatesPaula Ross, MD or one of the following Advanced Practice Providers on your designated Care Team: Tereso NewcomerScott Weaver, PA-C Vin SeminoleBhagat, [place] JerseyPA-C . Berton BonJanine Hammond, NP  Any Other Special Instructions Will Be Listed Below (If Applicable).

## 2018-02-03 NOTE — Telephone Encounter (Signed)
-----   Message from [name], [place] sent at 02/03/2018  3:45 PM EST ----- Regarding: Home Sleep Study Pt is ordered to have a home sleep study by Tereso NewcomerScott Weaver PA-C   Pt: Fred Maxwell [phone]  Dx: Snoring

## 2018-02-19 NOTE — Telephone Encounter (Signed)
Called and lmtcb on home phone.

## 2018-02-20 ENCOUNTER — Encounter: Payer: Self-pay | Admitting: *Deleted

## 2018-02-20 NOTE — Telephone Encounter (Signed)
Called lmtcb on cell and home phone.

## 2018-02-24 NOTE — Telephone Encounter (Signed)
Return call: Fred Maxwell called back to give correct INSURANCE information for her husband.  BCBS Subscriber#[id number] GROUP# [id number] pROVIDER SERVICE# [phone]

## 2018-02-27 ENCOUNTER — Telehealth: Payer: Self-pay | Admitting: *Deleted

## 2018-02-27 NOTE — Telephone Encounter (Signed)
-----   Message from Reesa Chew, CMA sent at 02/24/2018 10:57 AM EST ----- Regarding: New insurance info Sending a fax with correct info. Thanks ----- Message ----- From: Gaynelle Cage, CMA Sent: 02/12/2018  12:08 PM EST To: Reesa Chew, CMA Subject: RE: Home Sleep Study                           PLEASE CONTACT PATIENT TO SEE IF HE HAS NEW INSURANCE COVERAGE. ----- Message ----- From: Vernard Gambles, CMA Sent: 02/03/2018   3:45 PM EST To: Reesa Chew, CMA, Cv Div Sleep Studies Subject: Home Sleep Study                               Pt is ordered to have a home sleep study by Tereso Newcomer PA-C   Pt: Fred Maxwell [phone]  Dx: Snoring

## 2018-02-27 NOTE — Telephone Encounter (Signed)
Staff message sent to Leslye Peer auth received for HST. Auth # [id number]. Valid dates 02/27/18 to 04/27/18.

## 2018-03-02 NOTE — Telephone Encounter (Signed)
Patient called to say he wanted to cancel his home sleep study scheduled for Tuesday February 18th at 9 am  because he is moving to [place] this week. Sleep lab has been notified.

## 2018-03-02 NOTE — Telephone Encounter (Signed)
RE: [employer]  Claiborne Rigg M, [place]  Reesa Chew, CMA        Received Approval for HST. Ok to schedule. Yetta Numbers # [id number]. Valid dates 02/27/18 to 04/27/18

## 2018-03-13 ENCOUNTER — Other Ambulatory Visit: Payer: Self-pay | Admitting: *Deleted

## 2018-03-13 ENCOUNTER — Telehealth: Payer: Self-pay | Admitting: Internal Medicine

## 2018-03-13 MED ORDER — METOPROLOL SUCCINATE ER 100 MG PO TB24: 100.0000 mg | ORAL_TABLET | Freq: Every day | ORAL | 2 refills | Status: AC

## 2018-03-13 MED ORDER — IRBESARTAN 300 MG PO TABS: 300.0000 mg | ORAL_TABLET | Freq: Every day | ORAL | 2 refills | Status: AC

## 2018-03-13 MED ORDER — AMLODIPINE BESYLATE 5 MG PO TABS: 5.0000 mg | ORAL_TABLET | Freq: Every day | ORAL | 2 refills | Status: AC

## 2018-03-13 MED ORDER — PANTOPRAZOLE SODIUM 40 MG PO TBEC: 40.0000 mg | DELAYED_RELEASE_TABLET | Freq: Every day | ORAL | 2 refills | Status: DC

## 2018-03-13 MED ORDER — ATORVASTATIN CALCIUM 80 MG PO TABS: 80.0000 mg | ORAL_TABLET | Freq: Every day | ORAL | 2 refills | Status: AC

## 2018-03-13 NOTE — Telephone Encounter (Signed)
Rx's have been sent in as requested. Confirmation received. 

## 2018-03-13 NOTE — Telephone Encounter (Signed)
°*  STAT* If patient is at the pharmacy, call can be transferred to refill team.   1. Which medications need to be refilled? (please list name of each medication and dose if known)  metoprolol succinate (TOPROL-XL) 100 MG 24 hr tablet pantoprazole (PROTONIX) 40 MG tablet amLODipine (NORVASC) 5 MG tablet atorvastatin (LIPITOR) 80 MG tablet irbesartan (AVAPRO) 300 MG tablet  2. Which pharmacy/location (including street and city if local pharmacy) is medication to be sent to? New pharmacy [facility] [address], [place] - [address] Patient stated doesn't need them yet just wanted the script sent to that pharmacy as he is moving to that area soon.   3. Do they need a 30 day or 90 day supply? 90 days

## 2018-03-17 ENCOUNTER — Other Ambulatory Visit: Payer: Self-pay | Admitting: Internal Medicine

## 2018-03-22 IMAGING — CT CT ANGIO CHEST
2 of 6 series · 19 of 36 positions shown · IV contrast (APPLIED)
Comparison: Chest x-ray 12/25/2016

CLINICAL DATA: 51-year-old male with a history of chest pain for
about 1 week.

EXAM:
CT ANGIOGRAPHY CHEST WITH CONTRAST
TECHNIQUE: Multidetector CT imaging of the chest was performed using the
standard protocol during bolus administration of intravenous
contrast. Multiplanar CT image reconstructions and MIPs were
obtained to evaluate the vascular anatomy.
CONTRAST:  100mL 15X3BL-9IT IOPAMIDOL (15X3BL-9IT) INJECTION 76%

[Series 9: thins · axial · 0.69mm/px · z∈[+1152,+1424]mm · 18 of 432 slices shown]
[im 22/432  lung]
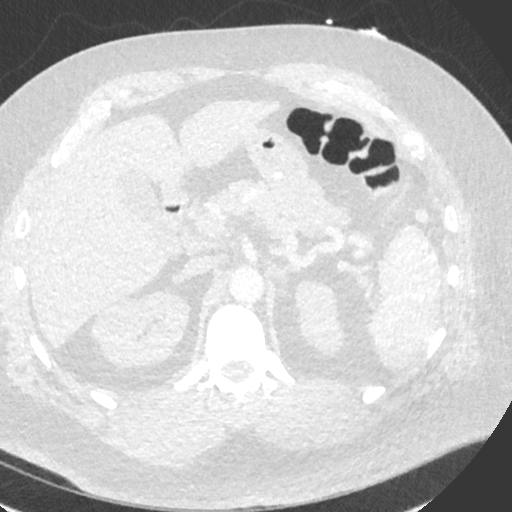
[im 44/432  mediastinal]
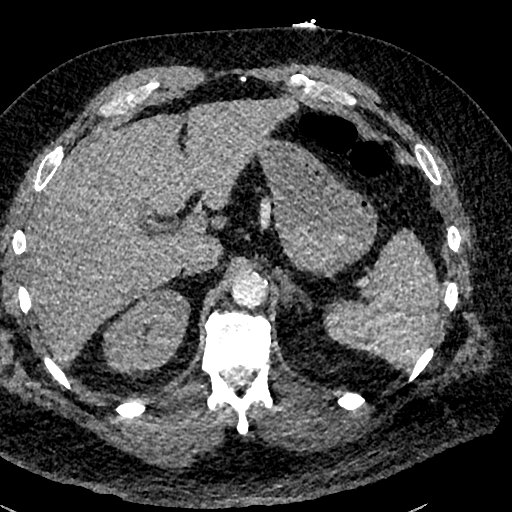
[im 65/432  lung]
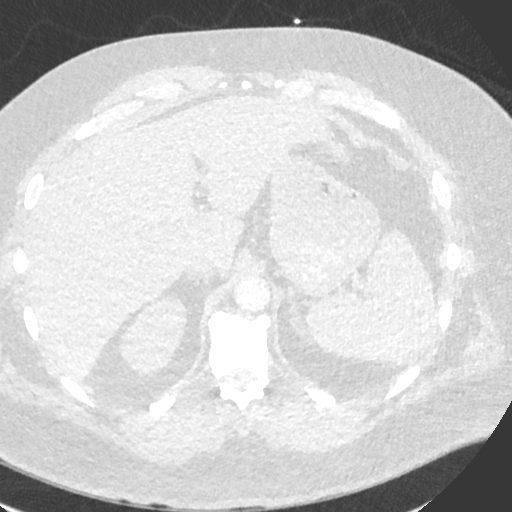
[im 87/432  mediastinal]
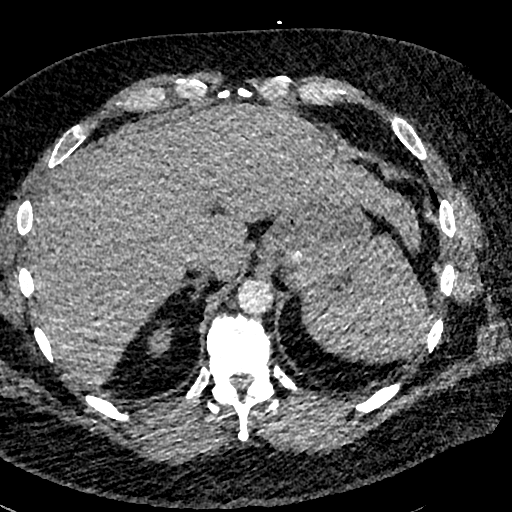
[im 108/432  lung]
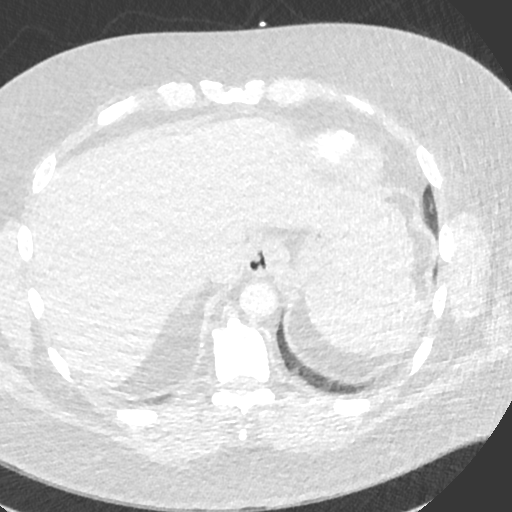
[im 130/432  mediastinal]
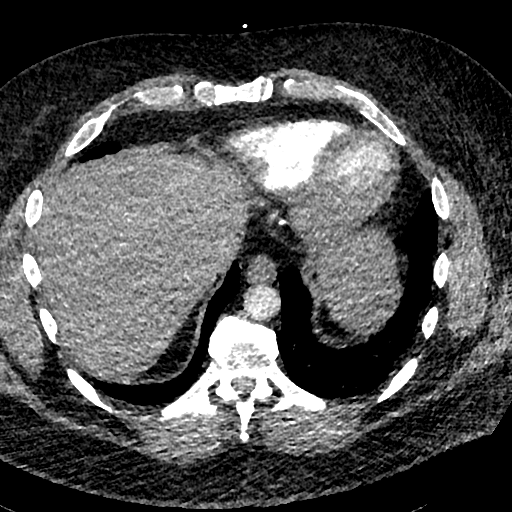
[im 151/432  lung]
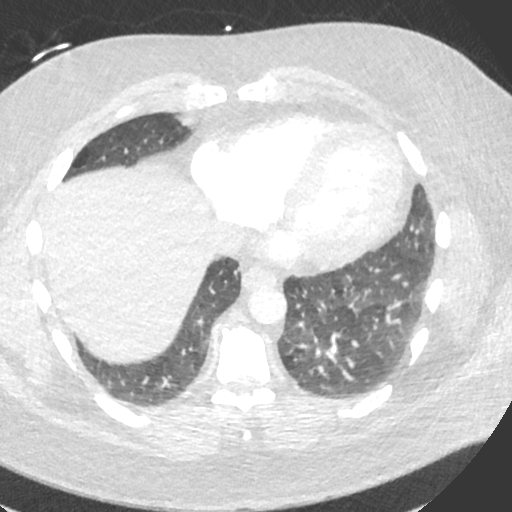
[im 173/432  mediastinal]
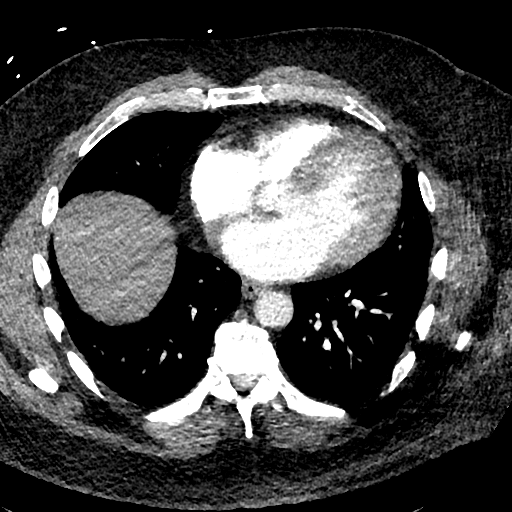
[im 194/432  lung]
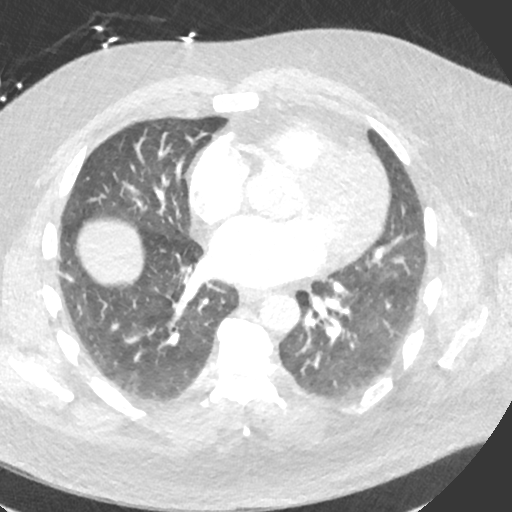
[im 238/432  mediastinal]
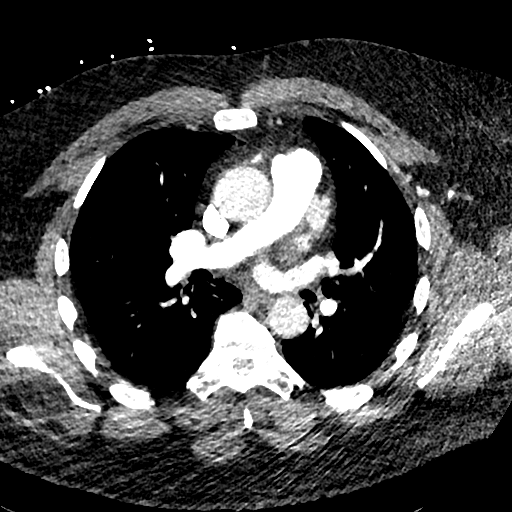
[im 259/432  lung]
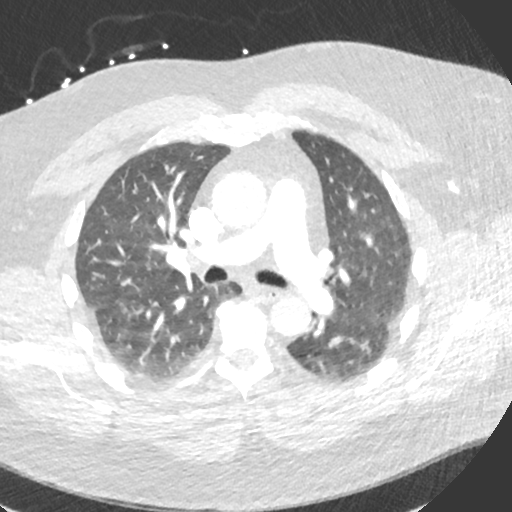
[im 281/432  mediastinal]
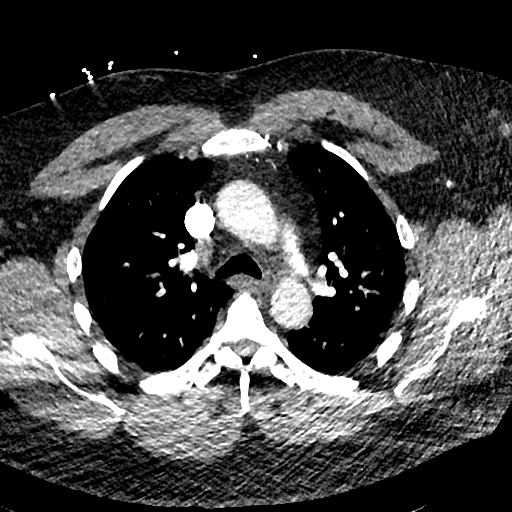
[im 302/432  lung]
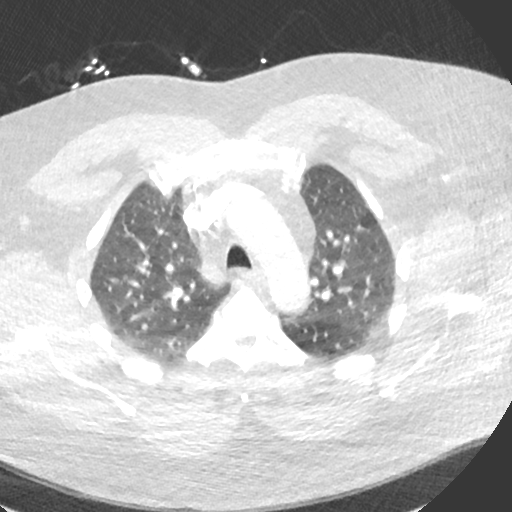
[im 324/432  mediastinal]
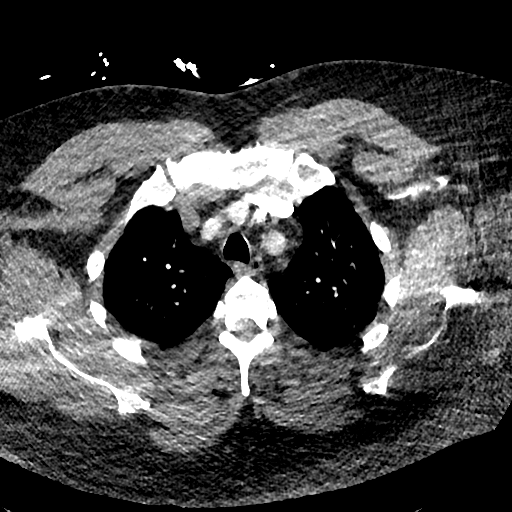
[im 345/432  lung]
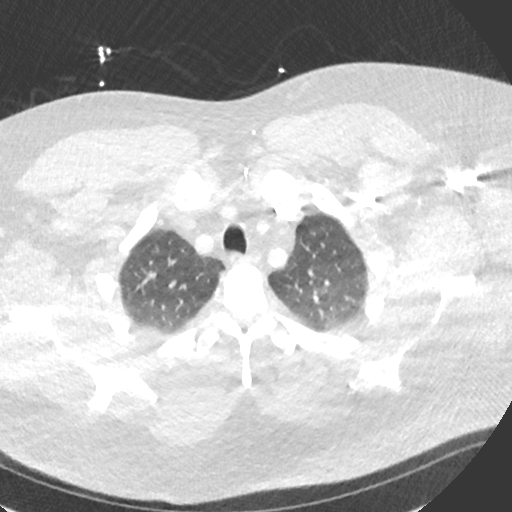
[im 367/432  mediastinal]
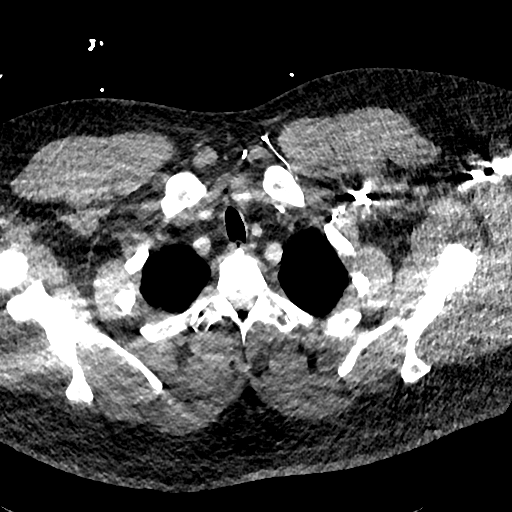
[im 388/432  lung]
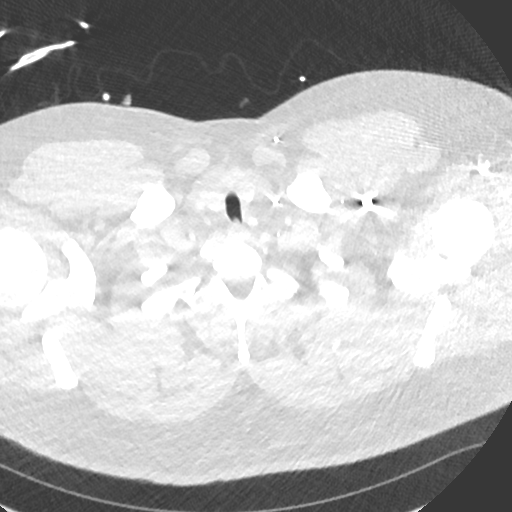
[im 410/432  mediastinal]
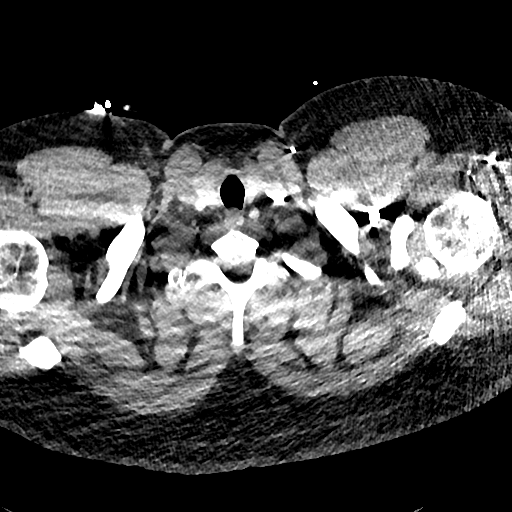

[Series 10: cor · coronal · 0.56mm/px · 1 of 151 slices shown]
[im 76/151  mediastinal]
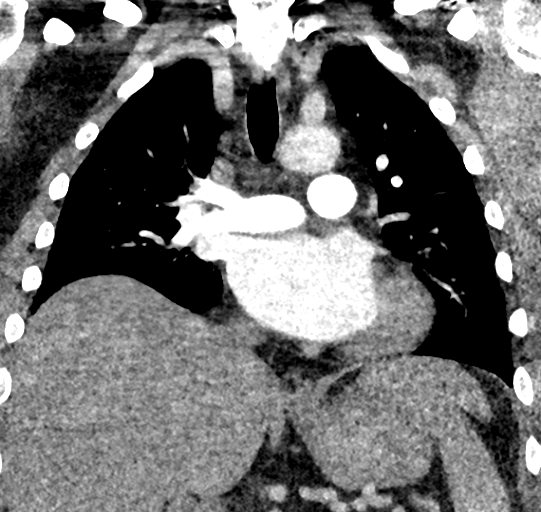

[19 of 36 positions shown; findings below may reference images not displayed]

FINDINGS: Cardiovascular:

Heart:

No cardiomegaly. No pericardial fluid/thickening. No significant
coronary calcifications. Minimal aortic valve calcifications.

Aorta:

Unremarkable course, caliber, contour of the thoracic aorta. No
aneurysm or dissection flap. No periaortic fluid.

Pulmonary arteries:

No central, lobar, segmental, or proximal subsegmental filling
defects.

Mediastinum/Nodes: Mediastinal lymph nodes are present, none of
which are enlarged by CT size criteria. Unremarkable appearance of
the thoracic esophagus.

Unremarkable appearance of the thoracic inlet and thyroid.

Lungs/Pleura: No confluent airspace disease, pneumothorax, or
pleural effusion. No endotracheal or endobronchial debris. No
bronchial wall thickening. Atelectasis, accounting for mild
geographic ground-glass appearance of the lungs.

Upper Abdomen: Unremarkable.

Musculoskeletal: No displaced fracture. No significant degenerative
changes of the spine.

Review of the MIP images confirms the above findings.
IMPRESSION: Study is negative for pulmonary emboli. No acute CT finding to
account for chest pain.

## 2018-03-22 IMAGING — CR DG CHEST 2V
2 series · 2 of 2 positions shown · non-contrast
Comparison: None.

CLINICAL DATA: Right-sided chest pain for couple of days.

EXAM:
CHEST  2 VIEW

[chest pa]
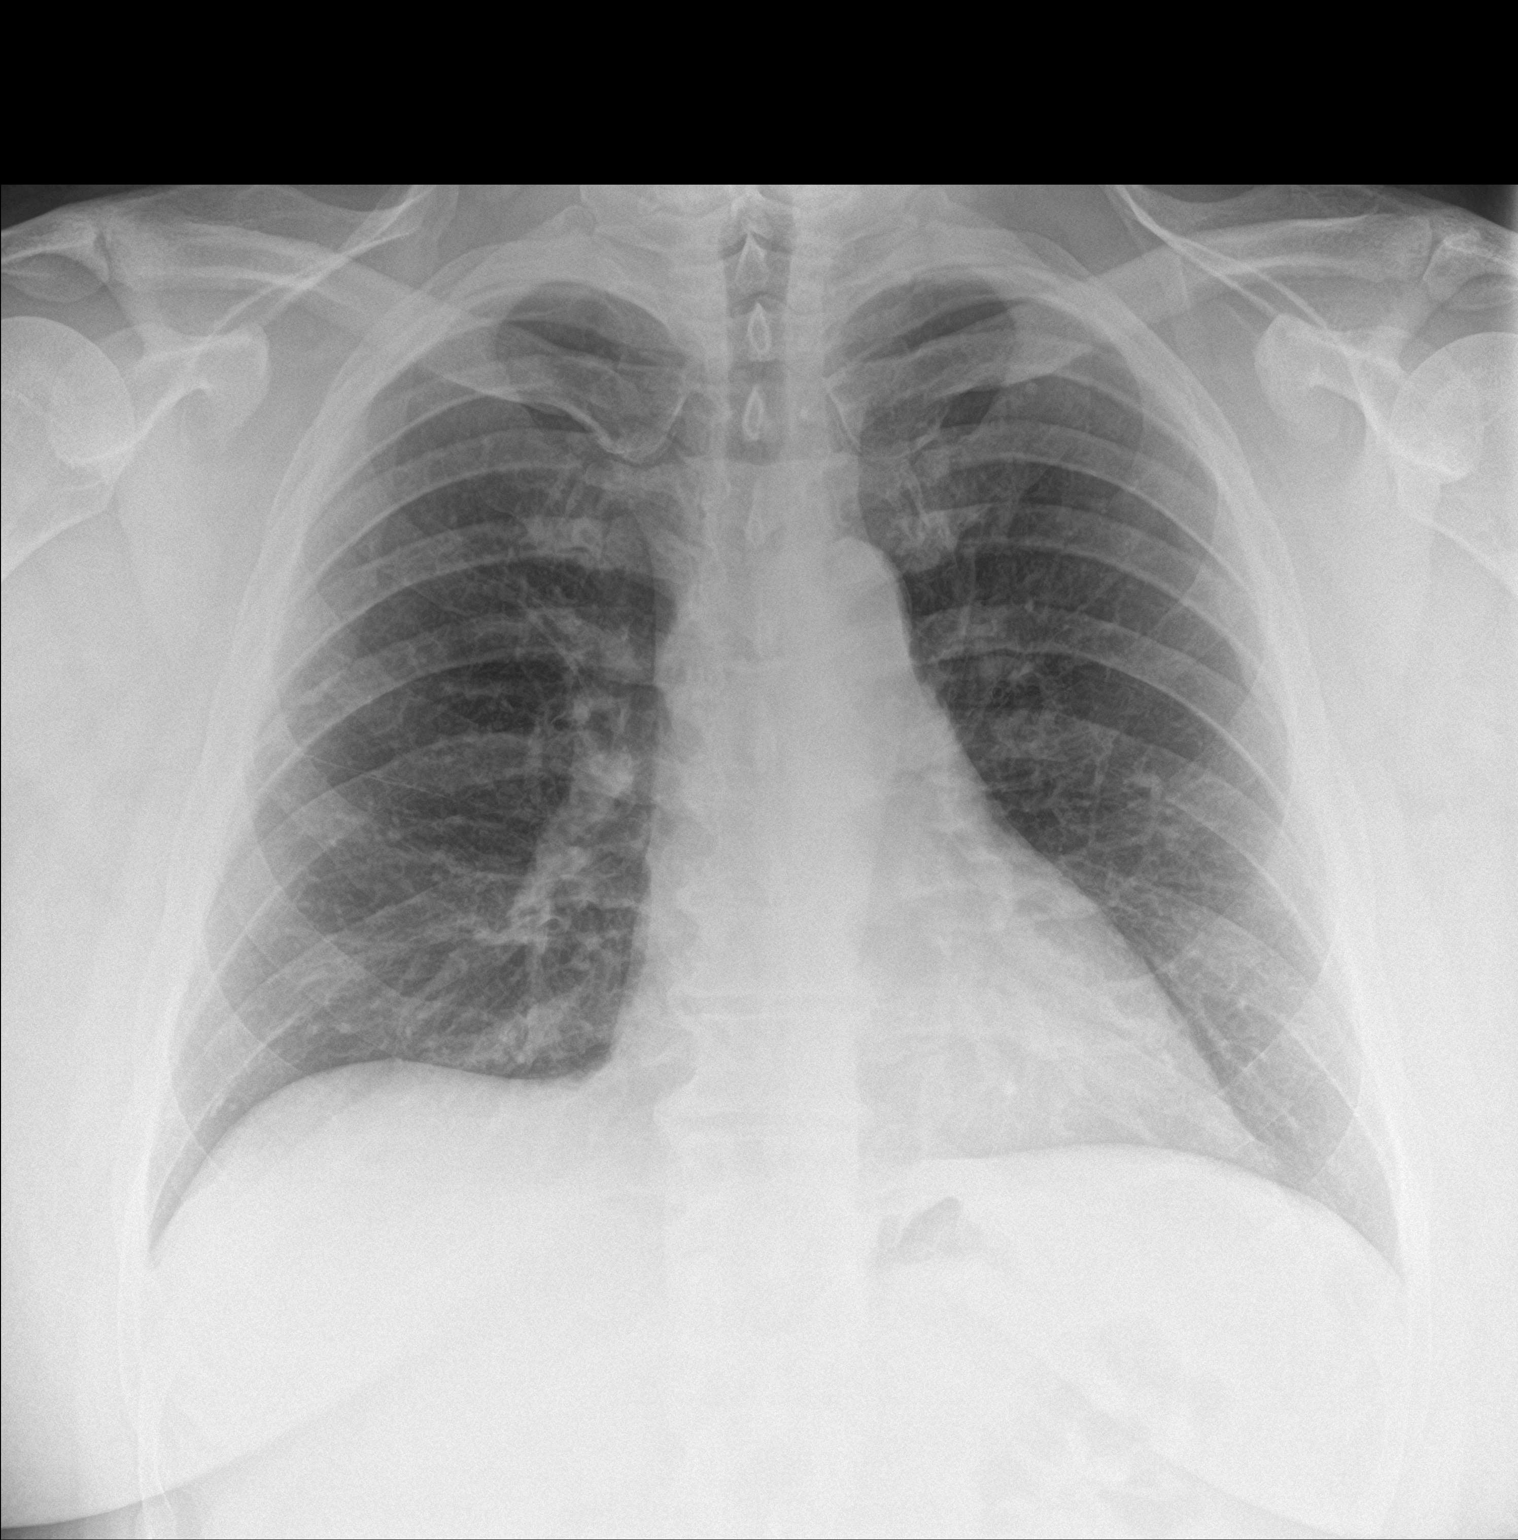

[chest lat]
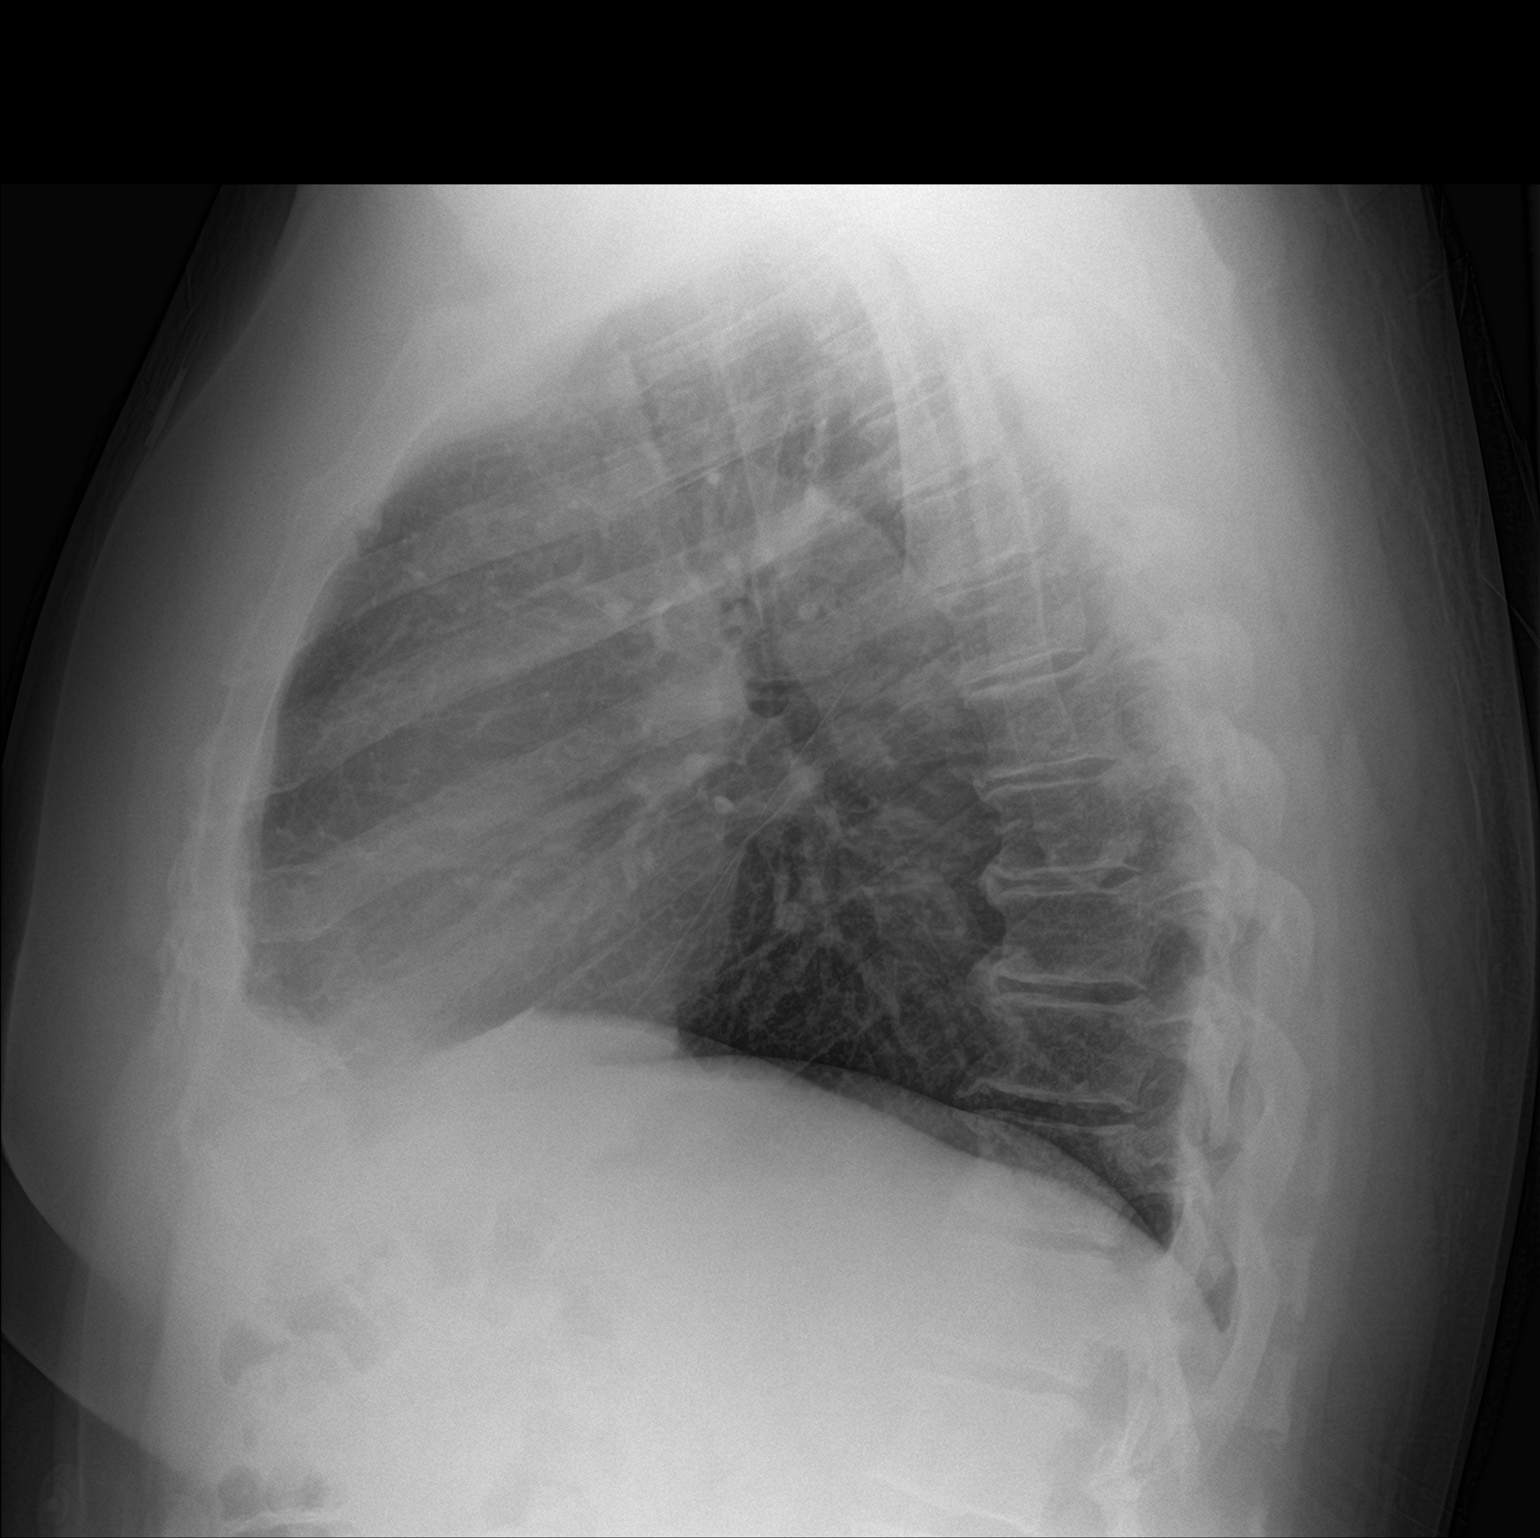

[2 of 2 positions shown; findings below may reference images not displayed]

FINDINGS: Normal heart size and pulmonary vascularity. No focal airspace
disease or consolidation in the lungs. No blunting of costophrenic
angles. No pneumothorax. Mediastinal contours appear intact.
Degenerative changes in the spine.
IMPRESSION: No active cardiopulmonary disease.

## 2018-03-24 ENCOUNTER — Encounter (HOSPITAL_BASED_OUTPATIENT_CLINIC_OR_DEPARTMENT_OTHER): Payer: BLUE CROSS/BLUE SHIELD

## 2019-05-13 ENCOUNTER — Encounter: Payer: Self-pay | Admitting: Internal Medicine
